# Patient Record
Sex: Female | Born: 1935 | Race: White | Hispanic: No | Marital: Married | Smoking: Never smoker
Health system: Southern US, Community
[De-identification: ages and names within clinical notes are randomized; demographics above are authoritative.]

## PROBLEM LIST (undated history)

## (undated) DIAGNOSIS — M1990 Unspecified osteoarthritis, unspecified site: Secondary | ICD-10-CM

## (undated) DIAGNOSIS — F0390 Unspecified dementia without behavioral disturbance: Secondary | ICD-10-CM

## (undated) DIAGNOSIS — C801 Malignant (primary) neoplasm, unspecified: Secondary | ICD-10-CM

## (undated) HISTORY — PX: CATARACT EXTRACTION: SUR2

## (undated) HISTORY — PX: KNEE ARTHROSCOPY W/ MENISCAL REPAIR: SHX1877

## (undated) HISTORY — PX: ABDOMINAL HYSTERECTOMY: SHX81

## (undated) HISTORY — PX: CHOLECYSTECTOMY: SHX55

---

## 1999-02-08 ENCOUNTER — Inpatient Hospital Stay (HOSPITAL_COMMUNITY): Admission: EM | Admit: 1999-02-08 | Discharge: 1999-03-11 | Payer: Self-pay | Admitting: *Deleted

## 1999-02-08 ENCOUNTER — Encounter (INDEPENDENT_AMBULATORY_CARE_PROVIDER_SITE_OTHER): Payer: Self-pay | Admitting: Specialist

## 1999-02-12 ENCOUNTER — Encounter (HOSPITAL_COMMUNITY): Payer: Self-pay | Admitting: Oncology

## 1999-02-13 ENCOUNTER — Encounter: Payer: Self-pay | Admitting: Endocrinology

## 1999-02-14 ENCOUNTER — Encounter: Payer: Self-pay | Admitting: Endocrinology

## 1999-02-16 ENCOUNTER — Encounter: Payer: Self-pay | Admitting: Internal Medicine

## 1999-02-18 ENCOUNTER — Encounter: Payer: Self-pay | Admitting: Internal Medicine

## 1999-02-20 ENCOUNTER — Encounter: Payer: Self-pay | Admitting: Internal Medicine

## 1999-02-22 ENCOUNTER — Encounter: Payer: Self-pay | Admitting: *Deleted

## 1999-02-23 ENCOUNTER — Encounter: Payer: Self-pay | Admitting: Surgery

## 1999-02-24 ENCOUNTER — Encounter: Payer: Self-pay | Admitting: *Deleted

## 1999-03-11 ENCOUNTER — Inpatient Hospital Stay: Admission: RE | Admit: 1999-03-11 | Discharge: 1999-03-18 | Payer: Self-pay | Admitting: *Deleted

## 1999-04-18 ENCOUNTER — Inpatient Hospital Stay (HOSPITAL_COMMUNITY): Admission: EM | Admit: 1999-04-18 | Discharge: 1999-05-02 | Payer: Self-pay | Admitting: *Deleted

## 1999-04-29 ENCOUNTER — Encounter: Payer: Self-pay | Admitting: *Deleted

## 1999-08-11 ENCOUNTER — Encounter: Payer: Self-pay | Admitting: Emergency Medicine

## 1999-08-11 ENCOUNTER — Emergency Department (HOSPITAL_COMMUNITY): Admission: EM | Admit: 1999-08-11 | Discharge: 1999-08-12 | Payer: Self-pay | Admitting: Emergency Medicine

## 1999-08-13 ENCOUNTER — Inpatient Hospital Stay (HOSPITAL_COMMUNITY): Admission: EM | Admit: 1999-08-13 | Discharge: 1999-09-02 | Payer: Self-pay | Admitting: Emergency Medicine

## 1999-10-28 ENCOUNTER — Ambulatory Visit (HOSPITAL_COMMUNITY): Admission: RE | Admit: 1999-10-28 | Discharge: 1999-10-28 | Payer: Self-pay | Admitting: *Deleted

## 2001-03-07 ENCOUNTER — Encounter: Payer: Self-pay | Admitting: *Deleted

## 2001-03-07 ENCOUNTER — Encounter: Admission: RE | Admit: 2001-03-07 | Discharge: 2001-03-07 | Payer: Self-pay | Admitting: *Deleted

## 2003-10-27 ENCOUNTER — Emergency Department (HOSPITAL_COMMUNITY): Admission: EM | Admit: 2003-10-27 | Discharge: 2003-10-27 | Payer: Self-pay | Admitting: Emergency Medicine

## 2004-05-09 ENCOUNTER — Encounter: Admission: RE | Admit: 2004-05-09 | Discharge: 2004-05-09 | Payer: Self-pay | Admitting: Internal Medicine

## 2004-06-14 ENCOUNTER — Emergency Department (HOSPITAL_COMMUNITY): Admission: EM | Admit: 2004-06-14 | Discharge: 2004-06-14 | Payer: Self-pay | Admitting: Emergency Medicine

## 2004-06-15 ENCOUNTER — Inpatient Hospital Stay (HOSPITAL_COMMUNITY): Admission: EM | Admit: 2004-06-15 | Discharge: 2004-06-17 | Payer: Self-pay | Admitting: Emergency Medicine

## 2004-06-16 ENCOUNTER — Encounter (INDEPENDENT_AMBULATORY_CARE_PROVIDER_SITE_OTHER): Payer: Self-pay | Admitting: Specialist

## 2004-08-03 ENCOUNTER — Encounter (INDEPENDENT_AMBULATORY_CARE_PROVIDER_SITE_OTHER): Payer: Self-pay | Admitting: Specialist

## 2004-08-03 ENCOUNTER — Ambulatory Visit (HOSPITAL_COMMUNITY): Admission: RE | Admit: 2004-08-03 | Discharge: 2004-08-03 | Payer: Self-pay | Admitting: *Deleted

## 2005-03-22 ENCOUNTER — Ambulatory Visit (HOSPITAL_COMMUNITY): Admission: RE | Admit: 2005-03-22 | Discharge: 2005-03-22 | Payer: Self-pay | Admitting: *Deleted

## 2006-09-05 ENCOUNTER — Emergency Department (HOSPITAL_COMMUNITY): Admission: EM | Admit: 2006-09-05 | Discharge: 2006-09-05 | Payer: Self-pay | Admitting: Emergency Medicine

## 2006-09-05 ENCOUNTER — Encounter: Payer: Self-pay | Admitting: Vascular Surgery

## 2009-09-01 ENCOUNTER — Inpatient Hospital Stay (HOSPITAL_COMMUNITY): Admission: EM | Admit: 2009-09-01 | Discharge: 2009-09-03 | Payer: Self-pay | Admitting: Emergency Medicine

## 2009-10-13 ENCOUNTER — Emergency Department (HOSPITAL_COMMUNITY): Admission: EM | Admit: 2009-10-13 | Discharge: 2009-10-13 | Payer: Self-pay | Admitting: Emergency Medicine

## 2009-10-19 ENCOUNTER — Emergency Department (HOSPITAL_COMMUNITY): Admission: EM | Admit: 2009-10-19 | Discharge: 2009-10-19 | Payer: Self-pay | Admitting: Emergency Medicine

## 2010-08-19 ENCOUNTER — Emergency Department (HOSPITAL_COMMUNITY): Payer: 59

## 2010-08-19 ENCOUNTER — Emergency Department (HOSPITAL_COMMUNITY)
Admission: EM | Admit: 2010-08-19 | Discharge: 2010-08-19 | Disposition: A | Discharge status: Home / Self Care | Payer: 59 | Attending: Emergency Medicine | Admitting: Emergency Medicine

## 2010-08-19 DIAGNOSIS — I2510 Atherosclerotic heart disease of native coronary artery without angina pectoris: Secondary | ICD-10-CM | POA: Insufficient documentation

## 2010-08-19 DIAGNOSIS — I498 Other specified cardiac arrhythmias: Secondary | ICD-10-CM | POA: Insufficient documentation

## 2010-08-19 DIAGNOSIS — F0390 Unspecified dementia without behavioral disturbance: Secondary | ICD-10-CM | POA: Insufficient documentation

## 2010-08-19 DIAGNOSIS — R071 Chest pain on breathing: Secondary | ICD-10-CM | POA: Insufficient documentation

## 2010-08-19 DIAGNOSIS — Z7901 Long term (current) use of anticoagulants: Secondary | ICD-10-CM | POA: Insufficient documentation

## 2010-08-19 DIAGNOSIS — I4891 Unspecified atrial fibrillation: Secondary | ICD-10-CM | POA: Insufficient documentation

## 2010-08-19 DIAGNOSIS — R079 Chest pain, unspecified: Secondary | ICD-10-CM | POA: Insufficient documentation

## 2010-08-19 DIAGNOSIS — I10 Essential (primary) hypertension: Secondary | ICD-10-CM | POA: Insufficient documentation

## 2010-08-19 LAB — CBC
MCH: 29.2 pg (ref 26.0–34.0)
MCHC: 33 g/dL (ref 30.0–36.0)
MCV: 88.3 fL (ref 78.0–100.0)
Platelets: 183 10*3/uL (ref 150–400)
RBC: 5.04 MIL/uL (ref 3.87–5.11)
RDW: 13.9 % (ref 11.5–15.5)
WBC: 9.4 10*3/uL (ref 4.0–10.5)

## 2010-08-19 LAB — COMPREHENSIVE METABOLIC PANEL
ALT: 26 U/L (ref 0–35)
AST: 42 U/L — ABNORMAL HIGH (ref 0–37)
Albumin: 3 g/dL — ABNORMAL LOW (ref 3.5–5.2)
Alkaline Phosphatase: 167 U/L — ABNORMAL HIGH (ref 39–117)
BUN: 23 mg/dL (ref 6–23)
CO2: 26 mEq/L (ref 19–32)
Calcium: 8.8 mg/dL (ref 8.4–10.5)
Chloride: 103 mEq/L (ref 96–112)
Creatinine, Ser: 1.28 mg/dL — ABNORMAL HIGH (ref 0.4–1.2)
GFR calc Af Amer: 48 mL/min — ABNORMAL LOW (ref >60)
Glucose, Bld: 91 mg/dL (ref 70–99)
Potassium: 4.3 mEq/L (ref 3.5–5.1)
Sodium: 137 mEq/L (ref 135–145)
Total Bilirubin: 0.5 mg/dL (ref 0.3–1.2)
Total Protein: 7.6 g/dL (ref 6.0–8.3)

## 2010-08-19 LAB — DIFFERENTIAL
Basophils Absolute: 0.1 10*3/uL (ref 0.0–0.1)
Basophils Relative: 1 % (ref 0–1)
Eosinophils Absolute: 0.2 10*3/uL (ref 0.0–0.7)
Eosinophils Relative: 2 % (ref 0–5)
Lymphocytes Relative: 17 % (ref 12–46)
Lymphs Abs: 1.6 10*3/uL (ref 0.7–4.0)
Neutro Abs: 6.2 10*3/uL (ref 1.7–7.7)
Neutrophils Relative %: 66 % (ref 43–77)

## 2010-08-19 LAB — POCT CARDIAC MARKERS
CKMB, poc: <1 ng/mL — ABNORMAL LOW (ref 1.0–8.0)
Myoglobin, poc: 109 ng/mL (ref 12–200)
Troponin i, poc: <0.05 ng/mL (ref 0.00–0.09)

## 2010-08-20 ENCOUNTER — Emergency Department (HOSPITAL_COMMUNITY)
Admission: EM | Admit: 2010-08-20 | Discharge: 2010-08-20 | Disposition: A | Discharge status: Home / Self Care | Payer: 59 | Attending: Emergency Medicine | Admitting: Emergency Medicine

## 2010-08-20 DIAGNOSIS — I4891 Unspecified atrial fibrillation: Secondary | ICD-10-CM | POA: Insufficient documentation

## 2010-08-20 DIAGNOSIS — I2510 Atherosclerotic heart disease of native coronary artery without angina pectoris: Secondary | ICD-10-CM | POA: Insufficient documentation

## 2010-08-20 DIAGNOSIS — R071 Chest pain on breathing: Secondary | ICD-10-CM | POA: Insufficient documentation

## 2010-08-20 DIAGNOSIS — Z7901 Long term (current) use of anticoagulants: Secondary | ICD-10-CM | POA: Insufficient documentation

## 2010-08-20 DIAGNOSIS — Z86718 Personal history of other venous thrombosis and embolism: Secondary | ICD-10-CM | POA: Insufficient documentation

## 2010-08-20 DIAGNOSIS — I10 Essential (primary) hypertension: Secondary | ICD-10-CM | POA: Insufficient documentation

## 2010-08-20 DIAGNOSIS — F0390 Unspecified dementia without behavioral disturbance: Secondary | ICD-10-CM | POA: Insufficient documentation

## 2010-08-20 DIAGNOSIS — R0789 Other chest pain: Secondary | ICD-10-CM | POA: Insufficient documentation

## 2010-08-20 LAB — POCT CARDIAC MARKERS
CKMB, poc: 1 ng/mL (ref 1.0–8.0)
Myoglobin, poc: 116 ng/mL (ref 12–200)

## 2010-08-23 LAB — URINALYSIS, ROUTINE W REFLEX MICROSCOPIC
Bilirubin Urine: NEGATIVE
Glucose, UA: NEGATIVE mg/dL
Hgb urine dipstick: NEGATIVE
Ketones, ur: NEGATIVE mg/dL
Nitrite: POSITIVE — AB
Protein, ur: 100 mg/dL — AB
Specific Gravity, Urine: 1.012 (ref 1.005–1.030)
Urobilinogen, UA: 0.2 mg/dL (ref 0.0–1.0)
pH: 7 (ref 5.0–8.0)

## 2010-08-23 LAB — CSF CELL COUNT WITH DIFFERENTIAL
RBC Count, CSF: 37 /mm3 — ABNORMAL HIGH
Tube #: 1
WBC, CSF: 1 /mm3 (ref 0–5)
WBC, CSF: 1 /mm3 (ref 0–5)

## 2010-08-23 LAB — URINE CULTURE: Colony Count: >=100000

## 2010-08-23 LAB — COMPREHENSIVE METABOLIC PANEL
ALT: 20 U/L (ref 0–35)
AST: 46 U/L — ABNORMAL HIGH (ref 0–37)
Albumin: 2.8 g/dL — ABNORMAL LOW (ref 3.5–5.2)
Alkaline Phosphatase: 172 U/L — ABNORMAL HIGH (ref 39–117)
BUN: 24 mg/dL — ABNORMAL HIGH (ref 6–23)
CO2: 25 mEq/L (ref 19–32)
Calcium: 8.2 mg/dL — ABNORMAL LOW (ref 8.4–10.5)
Chloride: 104 mEq/L (ref 96–112)
Creatinine, Ser: 1.25 mg/dL — ABNORMAL HIGH (ref 0.4–1.2)
GFR calc Af Amer: 49 mL/min — ABNORMAL LOW (ref >60)
GFR calc non Af Amer: 41 mL/min — ABNORMAL LOW (ref >60)
Glucose, Bld: 113 mg/dL — ABNORMAL HIGH (ref 70–99)
Potassium: 4.8 mEq/L (ref 3.5–5.1)
Sodium: 135 mEq/L (ref 135–145)
Total Bilirubin: 0.9 mg/dL (ref 0.3–1.2)
Total Protein: 7.1 g/dL (ref 6.0–8.3)

## 2010-08-23 LAB — DIFFERENTIAL
Band Neutrophils: 0 % (ref 0–10)
Basophils Absolute: 0 10*3/uL (ref 0.0–0.1)
Basophils Relative: 0 % (ref 0–1)
Blasts: 0 %
Eosinophils Absolute: 0.4 10*3/uL (ref 0.0–0.7)
Eosinophils Relative: 2 % (ref 0–5)
Lymphocytes Relative: 11 % — ABNORMAL LOW (ref 12–46)
Lymphs Abs: 2.1 10*3/uL (ref 0.7–4.0)
Metamyelocytes Relative: 0 %
Monocytes Absolute: 1.7 10*3/uL — ABNORMAL HIGH (ref 0.1–1.0)
Monocytes Relative: 9 % (ref 3–12)
Myelocytes: 0 %
Neutro Abs: 14.5 10*3/uL — ABNORMAL HIGH (ref 1.7–7.7)
Neutrophils Relative %: 78 % — ABNORMAL HIGH (ref 43–77)
Promyelocytes Absolute: 0 %
WBC Morphology: INCREASED
nRBC: 0 /100 WBC

## 2010-08-23 LAB — GRAM STAIN: Gram Stain: NONE SEEN

## 2010-08-23 LAB — CBC
HCT: 39.8 % (ref 36.0–46.0)
Hemoglobin: 13 g/dL (ref 12.0–15.0)
MCHC: 32.5 g/dL (ref 30.0–36.0)
MCV: 79.7 fL (ref 78.0–100.0)
Platelets: 178 10*3/uL (ref 150–400)
RBC: 5 MIL/uL (ref 3.87–5.11)
RDW: 25 % — ABNORMAL HIGH (ref 11.5–15.5)
WBC: 18.7 10*3/uL — ABNORMAL HIGH (ref 4.0–10.5)

## 2010-08-23 LAB — CSF CULTURE W GRAM STAIN
Culture: NO GROWTH
Gram Stain: NONE SEEN

## 2010-08-23 LAB — URINE MICROSCOPIC-ADD ON

## 2010-08-23 LAB — PROTEIN AND GLUCOSE, CSF
Glucose, CSF: 52 mg/dL (ref 43–76)
Total  Protein, CSF: 29 mg/dL (ref 15–45)

## 2010-08-23 LAB — SEDIMENTATION RATE: Sed Rate: 22 mm/hr (ref 0–22)

## 2010-08-24 LAB — CBC
HCT: 30.1 % — ABNORMAL LOW (ref 36.0–46.0)
Hemoglobin: 9.6 g/dL — ABNORMAL LOW (ref 12.0–15.0)
Hemoglobin: 9.7 g/dL — ABNORMAL LOW (ref 12.0–15.0)
MCHC: 32.1 g/dL (ref 30.0–36.0)
MCHC: 32.3 g/dL (ref 30.0–36.0)
MCV: 73 fL — ABNORMAL LOW (ref 78.0–100.0)
RBC: 4.07 MIL/uL (ref 3.87–5.11)
RBC: 4.16 MIL/uL (ref 3.87–5.11)
RDW: 27.1 % — ABNORMAL HIGH (ref 11.5–15.5)
RDW: 27.8 % — ABNORMAL HIGH (ref 11.5–15.5)

## 2010-08-29 LAB — BASIC METABOLIC PANEL
CO2: 26 mEq/L (ref 19–32)
Calcium: 8.4 mg/dL (ref 8.4–10.5)
Creatinine, Ser: 1.19 mg/dL (ref 0.4–1.2)
GFR calc Af Amer: 52 mL/min — ABNORMAL LOW (ref >60)
GFR calc non Af Amer: 43 mL/min — ABNORMAL LOW (ref >60)
Glucose, Bld: 97 mg/dL (ref 70–99)

## 2010-08-29 LAB — CBC
HCT: 29 % — ABNORMAL LOW (ref 36.0–46.0)
HCT: 29.2 % — ABNORMAL LOW (ref 36.0–46.0)
Hemoglobin: 9.3 g/dL — ABNORMAL LOW (ref 12.0–15.0)
MCHC: 31.7 g/dL (ref 30.0–36.0)
MCHC: 32 g/dL (ref 30.0–36.0)
MCV: 72.4 fL — ABNORMAL LOW (ref 78.0–100.0)
Platelets: 207 10*3/uL (ref 150–400)
RBC: 4.01 MIL/uL (ref 3.87–5.11)
RDW: 28.3 % — ABNORMAL HIGH (ref 11.5–15.5)
WBC: 6.6 10*3/uL (ref 4.0–10.5)

## 2010-08-29 LAB — POCT CARDIAC MARKERS
CKMB, poc: 3.7 ng/mL (ref 1.0–8.0)
Myoglobin, poc: >500 ng/mL (ref 12–200)
Troponin i, poc: <0.05 ng/mL (ref 0.00–0.09)

## 2010-08-29 LAB — URINALYSIS, ROUTINE W REFLEX MICROSCOPIC
Hgb urine dipstick: NEGATIVE
Specific Gravity, Urine: 1.014 (ref 1.005–1.030)
Urobilinogen, UA: 0.2 mg/dL (ref 0.0–1.0)
pH: 7.5 (ref 5.0–8.0)

## 2010-08-29 LAB — URINE CULTURE

## 2010-08-29 LAB — IRON AND TIBC
Iron: 18 ug/dL — ABNORMAL LOW (ref 42–135)
Saturation Ratios: 6 % — ABNORMAL LOW (ref 20–55)
UIBC: 278 ug/dL

## 2010-08-29 LAB — URINE MICROSCOPIC-ADD ON

## 2010-08-29 LAB — HEMOGLOBIN AND HEMATOCRIT, BLOOD
HCT: 29.6 % — ABNORMAL LOW (ref 36.0–46.0)
Hemoglobin: 9.4 g/dL — ABNORMAL LOW (ref 12.0–15.0)

## 2010-08-29 LAB — DIFFERENTIAL
Basophils Absolute: 0.1 10*3/uL (ref 0.0–0.1)
Eosinophils Absolute: 0.1 10*3/uL (ref 0.0–0.7)
Lymphocytes Relative: 14 % (ref 12–46)
Monocytes Absolute: 0.7 10*3/uL (ref 0.1–1.0)
Neutrophils Relative %: 74 % (ref 43–77)
Smear Review: ADEQUATE

## 2010-08-29 LAB — TYPE AND SCREEN: Antibody Screen: NEGATIVE

## 2010-08-29 LAB — PROTIME-INR: Prothrombin Time: 14.1 seconds (ref 11.6–15.2)

## 2010-08-29 LAB — LACTIC ACID, PLASMA: Lactic Acid, Venous: 2.5 mmol/L — ABNORMAL HIGH (ref 0.5–2.2)

## 2010-08-29 LAB — VITAMIN B12: Vitamin B-12: 1009 pg/mL — ABNORMAL HIGH (ref 211–911)

## 2010-10-21 NOTE — Op Note (Signed)
[name], Megan Ho                  ACCOUNT NO.:  [id number]   MEDICAL RECORD NO.:  [id number]          PATIENT TYPE:  INP   LOCATION:  0362                         FACILITY:  [facility]   PHYSICIAN:  Georgiana Spinner, M.D.    DATE OF BIRTH:  11/05/1921   DATE OF PROCEDURE:  06/16/2004  DATE OF DISCHARGE:                                 OPERATIVE REPORT   PROCEDURE:  Upper endoscopy with biopsy.   INDICATIONS:  Abdominal pain.   ANESTHESIA:  Demerol 30, Versed 4 milligrams.   PROCEDURE:  With the patient mildly sedated in the left lateral decubitus  position the Olympus videoscopic endoscope was inserted in the mouth and  passed under direct vision into the esophagus and upon entering the  esophagus appeared to be small bowel blood in the esophagus.  We advanced  distally to the gastroesophageal junction into the stomach where there was  some dark material, a small amount in the stomach, indicating some blood  mixed with acid,  but the fundus, body, antrum, duodenal bulb, second  portion of duodenum were visualized and appeared normal. Photographs taken.  From this point the endoscope was slowly withdrawn taking circumferential  views of duodenal mucosa until the endoscope had been pulled back in the  stomach,  placed in retroflexion to view stomach from below. The endoscope  was then straightened and withdrawn taking circumferential views remaining  gastric and esophageal mucosa stopping in the distal esophagus were an ulcer  was seen that was greater than 1 cm in size.  It had a cottony white fluffy  base that was oozing some blood. This was biopsied and this once biopsies  were obtained around the perimeter of this ulcer, the endoscope was  withdrawn. The patient's vital signs and pulse oximeter remained stable. The  patient tolerated procedure well without apparent complication.   FINDINGS:  Esophageal ulcer.  Await biopsy reports.  Will add Carafate and  look for a clinical  response.      GMO/MEDQ  D:  06/16/2004  T:  06/16/2004  Job:  [id number]

## 2010-10-21 NOTE — Op Note (Signed)
[name], Megan Ho                  ACCOUNT NO.:  [id number]   MEDICAL RECORD NO.:  [id number]          PATIENT TYPE:  AMB   LOCATION:  ENDO                         FAC[facility]   PHYSICIAN:  Georgiana Spinner, M.D.    DATE OF BIRTH:  08-24-1921   DATE OF PROCEDURE:  08/03/2004  DATE OF DISCHARGE:                                 OPERATIVE REPORT   PROCEDURE:  Endoscopy with biopsy.   INDICATIONS FOR PROCEDURE:  GERD.   ANESTHESIA:  Demerol 30, Versed 3.5 mg.   DESCRIPTION OF PROCEDURE:  With the patient mildly sedated in the left  lateral decubitus position, the Olympus videoscopic endoscope was inserted  in the mouth and passed under direct vision through the esophagus which  appeared completely normal at this time. The previously noted ulcer was now  completely healed. There was no evidence of Barrett's esophagus. We entered  into the stomach.  The fundus, body and antrum were visualized as was the  duodenal bulb and second portion of the duodenum. From this point, the  endoscope was slowly withdrawn taking circumferential views of duodenal  mucosa until the endoscope had been pulled back into the stomach, placed in  retroflexion to view the stomach from below. The endoscope was then  straightened and withdrawn taking circumferential views remaining gastric  and esophageal mucosa stopping to photograph and biopsy changes of gastritis  in the antrum and body. There was also an AVM seen in the fundus which was  photographed only. The endoscope was withdrawn. The patient's vital signs  and pulse oximeter remained stable. The patient tolerated procedure well  without apparent complications.   FINDINGS:  1.  AVM of fundus.  2.  Gastritis of the antrum and body biopsied, await biopsy report. The      patient will call me for results and follow-up with me as an outpatient.  3.  Complete healing of previously noted esophageal ulcer that had atypia on      biopsy previously.      GMO/MEDQ  D:  08/03/2004  T:  08/03/2004  Job:  [id number]

## 2010-10-21 NOTE — Discharge Summary (Signed)
[place]  Patient:    Megan Ho, Megan Ho                           MRN: [id number] Adm. Date:  03/11/99 Disc. Date: 03/18/99 Attending:  Warrick Parisian, M.D.                           Discharge Summary  DATE OF BIRTH:  [birth year]  DISCHARGE DIAGNOSES:  1. Deconditioning.  2. Malnutrition.  3. 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  4. Renal insufficiency.  5. History of severe colonic ileus, which has resolved.  6. Hyponatremia, resolved.  7. Sacral decubitus ulcer.  8. Hypertension.  9. Depression. 10. Atrial fibrillation in the past, and now in current sinus rhythm. 11. Gastroesophageal reflux disease with gastritis. 12. Osteoporosis.  DISCHARGE MEDICATIONS:  1. Miacalcin nasal spray 200 units 1 spray daily in alternating nostrils.  2. Os-Cal 500 mg p.o. b.i.d.  3. Prilosec 20 mg p.o. q.d.  4. Tiazac 240 mg 1 daily.  5. Folic acid 1 mg p.o. q.d.  6. Effexor XR 37.5 mg p.o. q.d.  7. Ferrous sulfate 325 mg p.o. b.i.d.  8. Multivitamin 1 p.o. q.d.  9. Prednisone 20 mg 2 tablets daily.  ACTIVITY:  Up with assistance.  DIET:  Supplements three times daily, with regular diet.  WOUND CARE:  Sacral ulcer, home health nurse will help with dressing changes at  least weekly, and wound care management.  [facility] was set up for a nurse, aide, and physical therapist.  FOLLOW-UP:  In one week with Dr. Traci Sermon.  At that time, the patient will need a CBC and CMET.  HISTORY OF PRESENT ILLNESS:  For details of admission, please refer to the priority transfer summary found in the chart.  Briefly, the patient is a 77 year old female who was transferred to [facility] on March 11, 1999, after a prolonged hospitalization at [facility] for multiple medical problems.  The problems seemed to have stabilized, and she was transferred to [facility] for rehabilitation with physical therapy,  occupational therapy, etc.  The patients laboratories during her course at [facility] remained stable.  Hemoglobin was 8.3 on arrival, platelet count 123,000.  Sodium 133, BUN 35, creatinine 1.1. Alkaline phosphatase 361, SGOT 82, SGPT 85.  Albumin 1.7 with a protein of 4.6 and a calcium of 7.3.  Her hemoglobin was 8.4 on March 16, 1999, platelet count 114,000, and this was all stable.  Her liver enzymes had also remained relatively stable while she was hospitalized.  She underwent physical therapy and rehabilitation, with improvement in her strength as well as nutritional status.  She received treatment for her decubitus ulcer.  She did develop some lower extremity edema secondary o venous insufficiency and hypoalbuminemia.  This improved with elevation and ambulation as well as improved nutritional status.  The patient did have some dyspnea with exertion.  A PA and lateral chest x-ray showed small bilateral pleural effusions, left greater than right, with mild cardiomegaly and diffuse osteopenia. The patients O2 saturation remained in the mid to upper 90s, and she was felt to be stable and ready for discharge on March 18, 1999.  She was discharged home  with home health nurse, aide, and physical therapy.  CONDITION ON DISCHARGE:  Stable and improved. DD:  06/15/99 TD:  06/15/99 Job: [id number]

## 2010-10-21 NOTE — Discharge Summary (Signed)
[name], [name]                  ACCOUNT NO.:  [id number]   MEDICAL RECORD NO.:  [id number]          PATIENT TYPE:  INP   LOCATION:  0362                         FACILITY:  [facility]   PHYSICIAN:  Mobolaji B. Bakare, M.D.DATE OF BIRTH:  1921-07-05   DATE OF ADMISSION:  06/15/2004  DATE OF DISCHARGE:  06/17/2004                                 DISCHARGE SUMMARY   PRIMARY CARE PHYSICIAN:  Dr. Georgianne Fick.   GASTROENTEROLOGIST:  Dr. Virginia Rochester.   CONSULTS:  Dr. Virginia Rochester.   FINAL DIAGNOSES:  1.  Esophageal ulcer.  2.  Klebsiella pneumoniae urinary tract infection.  3.  Diarrhea.  4.  Elevated alkaline phosphatase.   SECONDARY DIAGNOSES:  1.  History of atrial fibrillation.  2.  Autoimmune hepatitis.  3.  Liver cirrhosis.  4.  Hypertension.  5.  Gout.  6.  History of congestive heart failure.  7.  History of coronary artery disease.  8.  Sigmoid diverticulosis.   CHIEF COMPLAINT:  Abdominal pain.   BRIEF HISTORY:  Please refer to admission H&P for full details.  In brief,  Megan Ho is an 77 year old Caucasian female with multiple medical problems.  She presented with abdominal pain and prior to hospitalization she had an ER  visit whereby she was treated for abdominal pain and she improved, hence  sent home.  She had to come back because the abdominal pain recurred.  The  pain is more in the epigastric region and it recurs normally after taking  vitamins.  She had a normal CMET except for elevated alkaline phosphatase.  Lipase was normal.  CT scan of the abdomen showed cirrhotic liver, abnormal  lung base densities, 1 cm hypodensity right liver lobe, diverticulosis of  the sigmoid colon.   PROCEDURES:  Upper endoscopy:  Esophageal ulceration, greater than 1 cm in  size.  Biopsies were taken.  The ulceration was found at the distal  esophagus.   PERTINENT PHYSICAL FINDINGS:  Initial vital signs:  Blood pressure 159/88,  pulse of 71, respiratory rate of 22, temperature of 96.6.   [name] is a  frail, elderly Caucasian female, oriented in time, place, and person.  Main  findings were on abdominal examination.  Abdomen was soft, diffuse right  upper quadrant and left upper quadrant tenderness, also epigastric  tenderness.  There was no rebound or guarding.  Bowel sounds are present.  No palpable hepatosplenomegaly.  The rest of physical examination were  within normal.   PERTINENT LABORATORY FINDINGS:  White cell count 8.3, hemoglobin 13.9,  hematocrit 40.4, MCV 86.8, platelets 242.  Point of care cardiac enzymes  normal.  Sodium 136, potassium 4.0, chloride 108, bicarb 21, glucose 95, BUN  30, creatinine 1.3, calcium 8.1, albumin 2.7, AST 30, ALT 23, alkaline  phosphatase 172, total bilirubin 0.7, lipase 40.  Urinalysis was positive  for nitrite and leukocytes was moderate.  Microscopy showed many bacteria  but no white cells.  Urine culture grew Klebsiella pneumoniae greater than  100,000 sensitive to ciprofloxacin and resistant to ampicillin.  Lactic acid  1.5.  Fecal occult blood was  negative.  Phosphorus 4.2, magnesium 1.7.   RADIOLOGIC DATA:  CT scan as described in the HPI.   HOSPITAL COURSE:  Ms. Staples was admitted and managed conservatively.  Aleve  was discontinued.  She was given Aleve for myalgia which she probably  developed as a consequence of starting medication Lipitor.  She was taken  for endoscopy by Dr. Virginia Rochester and esophageal ulceration was found in the distal  esophagus and this is possibly the cause of epigastric pain.  She was  started on Carafate and continued on PPI.   The patient had a bout of diarrhea during the course of hospitalization.  This was also managed conservatively with Imodium and Fleet.   UTI:  She was empirically started on ciprofloxacin IV and when the culture  came back she was continued on p.o. Cipro.  She remained afebrile during the  course of hospitalization.   Elevated ALP is probably secondary to liver cirrhosis.   This will be  monitored by Dr. Nicholos Johns in the office or Dr. Virginia Rochester.  The patient at this  point has stopped Lipitor.  All other home medications were continued during  this hospitalization.   DISCHARGE CONDITION:  She was in stable condition.  Epigastric pain resolved  at the time of discharge and she was hemodynamically stable.   DISCHARGE MEDICATIONS:  1.  Ciprofloxacin 200 mg p.o. b.i.d.  2.  Carafate 1 g with meals and at bedtime four times a day.  3.  She was instructed to continue home medications and also to stop Aleve      and use Tylenol.   FOLLOW-UP:  Follow up with Dr. Nicholos Johns in 1-2 weeks and Dr. Virginia Rochester in 4  weeks; she should call for appointment.   RECOMMENDATIONS:  Follow-up CT scan of the abdomen and chest to follow up  the abnormal densities found on the lung bases and also the hypodensity area  in the right lobe of the liver.      MBB/MEDQ  D:  06/26/2004  T:  06/26/2004  Job:  [id number]   cc:   Georgianne Fick, M.D.  [address]  [place]  [place] [address]  Fax: [phone]   Georgiana Spinner, M.D.  [address]  [place]  [place] [address]  Fax: [phone]

## 2010-10-21 NOTE — H&P (Signed)
Megan Ho, Megan Ho NO.:  [id number]   MEDICAL RECORD NO.:  [id number]          PATIENT TYPE:  EMS   LOCATION:  ED                           FACILITY:  [facility]   PHYSICIAN:  Mallory Shirk, MD     DATE OF BIRTH:  [birth year]   DATE OF ADMISSION:  06/15/2004  DATE OF DISCHARGE:                                HISTORY & PHYSICAL   PRIMARY CARE PHYSICIAN:  Georgianne Fick, M.D.   GASTROENTEROLOGIST:  Georgiana Spinner, M.D.   CHIEF COMPLAINT:  Abdominal pain brought on by p.o. intake.   HISTORY OF PRESENT ILLNESS:  Megan Ho is a very pleasant 77 year old  Caucasian woman who presented to the emergency department at [facility] on  June 14, 2004 with complaints of abdominal pain. The patient states that  pain started after she and her husband went out for lunch earlier during the  day and the pain would not subside hence the patient came to the ED. She was  seen in the ED and treated with Toradol IV and morphine IV.  The pain  subsided.  Labs done at that time were essentially within normal limits with  a lipase of 40, LFT's normal except alkaline phosphatase which was slightly  elevated at 172.  Point of care cardiac enzymes were negative and patient  was discharged home.   However, the patient states that pain was unbearable at home and  subsequently patient came to the ED again on June 15, 2004 at about 1:30  a.m.  She was given morphine for pain and Zofran for nausea upon which  symptoms were relieved a little bit. Later in the morning, she was given  some cereal upon which her pain came back and was worse than it was before.  At this point, the patient was also given Vicodin but this did not seem to  control her pain.  The patient was subsequently admitted for further workup.   PAST MEDICAL HISTORY:  From PCP's office:  1.  HTN.  2.  CHF.  3.  CAD.  4.  Atrial fibrillation.  5.  GERD.  6.  Osteoporosis.  7.  Osteoarthritis.  8.  Autoimmune  hepatitis.   MEDICATIONS ON ADMISSION:  1.  Plavix 75 mcg p.o. q.d.  2.  Prilosec 20 mg p.o. q.d.  3.  Tiazac 240 mg p.o. q.d.  4.  Temazepam 15 mg p.o. q.d.  5.  Prochlorperazine 10 mg p.o. q.d. p.r.n.  6.  Os-Cal 500 mg p.o. b.i.d.  7.  MVI.  8.  Ferrous sulfate 325 mg p.o. b.i.d.   ALLERGIES:  The patient has a long list of allergies which include:  1.  WELLBUTRIN.  2.  SULFA.  3.  ASPIRIN.  4.  CODEINE.  5.  ERYTHROMYCIN.  6.  PHENERGAN.  7.  THORAZINE.  8.  PCN.  9.  BENADRYL.  10. BUTAZOLIDIN.  11. DEMEROL.  12. HYDROCODONE.  13. ENDOCET.   FAMILY HISTORY:  Noncontributory.   SOCIAL HISTORY:  The patient lives with her husband, no alcohol, tobacco or  drug use.   REVIEW OF SYMPTOMS:  The patient denies any headaches, any recent visual  changes, dysphagia, no chest pain at the time of exam, no respiratory  discomfort, diffuse abdominal pain as mentioned in the HPI, no dysuria, no  hematochezia, no recent change in bowel habits, no recent weight loss.  Chronic extremity pain secondary to osteoarthritis, pain in the right  extremity secondary to carpal tunnel syndrome.   PHYSICAL EXAMINATION ON ADMISSION:  VITAL SIGNS:  Blood pressure 159/88,  pulse 71, respirations 22, temperature 96.6.  GENERAL:  Frail, elderly Caucasian woman in mild distress, alert and  oriented x3, cooperative with the exam.  HEENT:  Normocephalic, atraumatic.  Pupils unequal, left pupil oval shaped.  The patient says she has had this condition for a long time.  NECK:  Supple, oropharynx nonerythematous, no JVD, no LAD.  LUNGS:  Clear to auscultation bilaterally, no wheezes, no rales.  CARDIOVASCULAR:  S1 plus S2, regular rate and rhythm.  ABDOMEN:  Soft, positive bowel sounds, diffuse right upper quadrant, left  upper quadrant and diffuse right upper quadrant, left upper quadrant,  epigastric and suprapubic pain.  EXTREMITIES:  Bilateral nonpitting edema in the lower extremities chronic   per patient.  NEUROLOGIC:  Cranial nerves II-XII grossly intact. Sensory and motor within  normal limits.  No focal deficit seen.   LABORATORY DATA:  Chest x-ray:  Stable bronchitis changes with left lower  lobe scarring, no acute disease.   Labs on admission, WBC is 8.3, hemoglobin 13.9, hematocrit 40.4, MCV 86.8,  platelets 242.  Point of care cardiac enzymes negative.  Sodium 136,  potassium 4.0, chloride 108, carbon dioxide 21, glucose 95, BUN 30,  creatinine 1.3, calcium 8.1, albumin 2.7.  AST 30, ALT 23, alkaline  phosphatase 172, total bilirubin 0.7, lipase 40.   ASSESSMENT:  An 77 year old Caucasian woman with a history of GERD, coronary  artery disease, CHF who presents with abdominal pain, onset noticed after  eating lunch on June 14, 2004.   1.  Abdominal pain likely secondary to gastritis.  The patient states that a      few days prior to this episode, her Aleve was increased to four a day.      This was to control myalgia secondary to Lipitor which has been now      stopped.  However, on exam, the patient's tenderness is pretty      significant, she also reports exquisite pain upon p.o. intake, liquid or      solid.  This was observed with a sip of water and pain started.  Dr.      Virginia Rochester, gastroenterology, who knows the patient well has been consulted. He      will see the patient.  A CT scan of the abdomen is ordered with and      without contrast to further evaluate the patient.  She is also on zofran      4 mg IV q.6 hours p.r.n. nausea and vomiting.  With the patient's      multiple pain allergies,  at this time she will be given Tylenol for      pain p.r.n.  Once GI workup is complete, a pain medication regimen will      be worked out.  2.  Gastroesophageal reflux disease.  The patient has been started on      protonix 40 mg IV q.d.  Gastroenterology evaluation pending as mentioned      in #1.  3.  Hypertension.  The patient's Tiazac has been started, admission  blood     pressure was 159/88 with a subsequent reading at 143/83.  4.  The patient is n.p.o. with IV fluids n.p.o. for now except medications      with IV fluids at 75 mg per hour. Home medications have been started.   DISPOSITION:  After a GI workup the patient will be discharged to home.      GDK/MEDQ  D:  06/15/2004  T:  06/15/2004  Job:  [id number]   cc:   Georgiana Spinner, M.D.  [address]  [place]  [place] [address]  Fax: [phone]   Georgianne Fick, M.D.  [address]  [place]  [place] [address]  Fax: [phone]

## 2013-07-02 ENCOUNTER — Other Ambulatory Visit: Payer: Self-pay | Admitting: Dermatology

## 2013-11-21 ENCOUNTER — Other Ambulatory Visit: Payer: Self-pay | Admitting: Dermatology

## 2014-04-16 ENCOUNTER — Emergency Department (HOSPITAL_COMMUNITY): Payer: Medicare Other

## 2014-04-16 ENCOUNTER — Encounter (HOSPITAL_COMMUNITY): Payer: Self-pay | Admitting: Emergency Medicine

## 2014-04-16 ENCOUNTER — Emergency Department (HOSPITAL_COMMUNITY)
Admission: EM | Admit: 2014-04-16 | Discharge: 2014-04-16 | Disposition: A | Discharge status: Home / Self Care | Payer: Medicare Other | Attending: Emergency Medicine | Admitting: Emergency Medicine

## 2014-04-16 DIAGNOSIS — R109 Unspecified abdominal pain: Secondary | ICD-10-CM

## 2014-04-16 DIAGNOSIS — Z90710 Acquired absence of both cervix and uterus: Secondary | ICD-10-CM | POA: Diagnosis not present

## 2014-04-16 DIAGNOSIS — Z79899 Other long term (current) drug therapy: Secondary | ICD-10-CM | POA: Diagnosis not present

## 2014-04-16 DIAGNOSIS — Z8739 Personal history of other diseases of the musculoskeletal system and connective tissue: Secondary | ICD-10-CM | POA: Insufficient documentation

## 2014-04-16 DIAGNOSIS — R1031 Right lower quadrant pain: Secondary | ICD-10-CM | POA: Diagnosis present

## 2014-04-16 DIAGNOSIS — Z7902 Long term (current) use of antithrombotics/antiplatelets: Secondary | ICD-10-CM | POA: Diagnosis not present

## 2014-04-16 DIAGNOSIS — F0390 Unspecified dementia without behavioral disturbance: Secondary | ICD-10-CM | POA: Insufficient documentation

## 2014-04-16 DIAGNOSIS — R609 Edema, unspecified: Secondary | ICD-10-CM | POA: Diagnosis not present

## 2014-04-16 DIAGNOSIS — Z85828 Personal history of other malignant neoplasm of skin: Secondary | ICD-10-CM | POA: Diagnosis not present

## 2014-04-16 DIAGNOSIS — Z9089 Acquired absence of other organs: Secondary | ICD-10-CM | POA: Insufficient documentation

## 2014-04-16 HISTORY — DX: Unspecified osteoarthritis, unspecified site: M19.90

## 2014-04-16 HISTORY — DX: Unspecified dementia, unspecified severity, without behavioral disturbance, psychotic disturbance, mood disturbance, and anxiety: F03.90

## 2014-04-16 HISTORY — DX: Malignant (primary) neoplasm, unspecified: C80.1

## 2014-04-16 LAB — URINALYSIS, ROUTINE W REFLEX MICROSCOPIC
BILIRUBIN URINE: NEGATIVE
Glucose, UA: NEGATIVE mg/dL
Hgb urine dipstick: NEGATIVE
KETONES UR: NEGATIVE mg/dL
NITRITE: NEGATIVE
Protein, ur: 30 mg/dL — AB
Specific Gravity, Urine: 1.017 (ref 1.005–1.030)
UROBILINOGEN UA: 0.2 mg/dL (ref 0.0–1.0)
pH: 5.5 (ref 5.0–8.0)

## 2014-04-16 LAB — CBC WITH DIFFERENTIAL/PLATELET
Basophils Absolute: 0.1 10*3/uL (ref 0.0–0.1)
Basophils Relative: 1 % (ref 0–1)
Eosinophils Absolute: 0.2 10*3/uL (ref 0.0–0.7)
Eosinophils Relative: 3 % (ref 0–5)
HCT: 39.3 % (ref 36.0–46.0)
HEMOGLOBIN: 13.5 g/dL (ref 12.0–15.0)
LYMPHS PCT: 18 % (ref 12–46)
Lymphs Abs: 1.5 10*3/uL (ref 0.7–4.0)
MCH: 30.3 pg (ref 26.0–34.0)
MCHC: 34.4 g/dL (ref 30.0–36.0)
MCV: 88.3 fL (ref 78.0–100.0)
MONO ABS: 1 10*3/uL (ref 0.1–1.0)
Monocytes Relative: 13 % — ABNORMAL HIGH (ref 3–12)
NEUTROS ABS: 5.5 10*3/uL (ref 1.7–7.7)
Neutrophils Relative %: 65 % (ref 43–77)
Platelets: 151 10*3/uL (ref 150–400)
RBC: 4.45 MIL/uL (ref 3.87–5.11)
RDW: 13.9 % (ref 11.5–15.5)
WBC: 8.2 10*3/uL (ref 4.0–10.5)

## 2014-04-16 LAB — COMPREHENSIVE METABOLIC PANEL
ALT: 36 U/L — ABNORMAL HIGH (ref 0–35)
ANION GAP: 11 (ref 5–15)
AST: 56 U/L — ABNORMAL HIGH (ref 0–37)
Albumin: 2.8 g/dL — ABNORMAL LOW (ref 3.5–5.2)
Alkaline Phosphatase: 247 U/L — ABNORMAL HIGH (ref 39–117)
BILIRUBIN TOTAL: 0.4 mg/dL (ref 0.3–1.2)
BUN: 23 mg/dL (ref 6–23)
CHLORIDE: 101 meq/L (ref 96–112)
CO2: 26 meq/L (ref 19–32)
CREATININE: 1.28 mg/dL — AB (ref 0.50–1.10)
Calcium: 9 mg/dL (ref 8.4–10.5)
GFR calc Af Amer: 41 mL/min — ABNORMAL LOW (ref >90)
GFR, EST NON AFRICAN AMERICAN: 36 mL/min — AB (ref >90)
Glucose, Bld: 108 mg/dL — ABNORMAL HIGH (ref 70–99)
Potassium: 3.9 mEq/L (ref 3.7–5.3)
Sodium: 138 mEq/L (ref 137–147)
Total Protein: 7.2 g/dL (ref 6.0–8.3)

## 2014-04-16 LAB — URINE MICROSCOPIC-ADD ON

## 2014-04-16 LAB — LIPASE, BLOOD: LIPASE: 60 U/L — AB (ref 11–59)

## 2014-04-16 MED ORDER — IOHEXOL 300 MG/ML  SOLN
50.0000 mL | Freq: Once | INTRAMUSCULAR | Status: AC | PRN
  Administered 2014-04-16: 50 mL via ORAL

## 2014-04-16 NOTE — ED Provider Notes (Signed)
CSN: [id number]     Arrival date & time 04/16/14  0018 History   First MD Initiated Contact with Patient 04/16/14 0042     Chief Complaint  Patient presents with  . Abdominal Pain     (Consider location/radiation/quality/duration/timing/severity/associated sxs/prior Treatment) Patient is a 80 y.o. female presenting with abdominal pain. The history is provided by the patient. No language interpreter was used.  Abdominal Pain Pain location:  RLQ Associated symptoms comment:  Patient is a poor historian due to dementia. Per her husband, she had sudden onset lower abdominal pain while getting ready for bed tonight. No vomiting or recent fever. He does not know when she had her last bowel movement. The patient currently denies pain and does not remember having pain earlier, c/w her baseline dementia according to her son, also at bedside.    Past Medical History  Diagnosis Date  . Dementia   . Arthritis   . Cancer     Skin   Past Surgical History  Procedure Laterality Date  . Abdominal hysterectomy    . Knee arthroscopy w/ meniscal repair    . Cataract extraction    . Cholecystectomy     No family history on file. History  Substance Use Topics  . Smoking status: Never Smoker   . Smokeless tobacco: Never Used  . Alcohol Use: No   OB History    No data available     Review of Systems  Unable to perform ROS: Dementia      Allergies  Thor-prom  Home Medications   Prior to Admission medications   Medication Sig Start Date End Date Taking? Authorizing Provider  Calcium-Vitamin D (CALTRATE 600 PLUS-VIT D PO) Take 1 tablet by mouth 2 (two) times daily.   Yes Historical Provider, MD  clopidogrel (PLAVIX) 75 MG tablet Take 1 tablet by mouth daily. 03/13/14  Yes Historical Provider, MD  memantine (NAMENDA) 5 MG tablet Take 1 tablet by mouth 3 (three) times daily.  02/25/14  Yes Historical Provider, MD  Multiple Vitamin (MULTIVITAMIN WITH MINERALS) TABS tablet Take 1 tablet by  mouth daily.   Yes Historical Provider, MD  TOPROL XL 25 MG 24 hr tablet Take 1 tablet by mouth daily. 04/08/14  Yes Historical Provider, MD   BP 176/99 mmHg  Pulse 61  Temp(Src) 97.4 F (36.3 C) (Oral)  Resp 18  SpO2 100% Physical Exam  Constitutional: She appears well-developed and well-nourished. No distress.  Well appearing, comfortable elderly female.  Eyes: Conjunctivae are normal.  Neck: Normal range of motion. Neck supple.  Cardiovascular: Normal rate.   No murmur heard. Pulmonary/Chest: Effort normal. She has no wheezes. She has no rales.  Abdominal: Soft. Bowel sounds are normal. She exhibits mass. There is tenderness.  RLQ abdominal tenderness with palpable mass more to midline abdomen that is less tender. Non-distended abdomen. BS positive.   Musculoskeletal:  Chronic bilateral lower extremity edema.   Neurological: She is alert. Coordination normal.  Skin: Skin is warm and dry.  Psychiatric: She has a normal mood and affect.    ED Course  Procedures (including critical care time) Labs Review Labs Reviewed  CBC WITH DIFFERENTIAL  COMPREHENSIVE METABOLIC PANEL  LIPASE, BLOOD  URINALYSIS, ROUTINE W REFLEX MICROSCOPIC   Results for orders placed or performed during the hospital encounter of 04/16/14  CBC with Differential  Result Value Ref Range   WBC 8.2 4.0 - 10.5 K/uL   RBC 4.45 3.87 - 5.11 MIL/uL   Hemoglobin 13.5 12.0 -  15.0 g/dL   HCT 39.3 36.0 - 46.0 %   MCV 88.3 78.0 - 100.0 fL   MCH 30.3 26.0 - 34.0 pg   MCHC 34.4 30.0 - 36.0 g/dL   RDW 13.9 11.5 - 15.5 %   Platelets 151 150 - 400 K/uL   Neutrophils Relative % 65 43 - 77 %   Neutro Abs 5.5 1.7 - 7.7 K/uL   Lymphocytes Relative 18 12 - 46 %   Lymphs Abs 1.5 0.7 - 4.0 K/uL   Monocytes Relative 13 (H) 3 - 12 %   Monocytes Absolute 1.0 0.1 - 1.0 K/uL   Eosinophils Relative 3 0 - 5 %   Eosinophils Absolute 0.2 0.0 - 0.7 K/uL   Basophils Relative 1 0 - 1 %   Basophils Absolute 0.1 0.0 - 0.1 K/uL   Comprehensive metabolic panel  Result Value Ref Range   Sodium 138 137 - 147 mEq/L   Potassium 3.9 3.7 - 5.3 mEq/L   Chloride 101 96 - 112 mEq/L   CO2 26 19 - 32 mEq/L   Glucose, Bld 108 (H) 70 - 99 mg/dL   BUN 23 6 - 23 mg/dL   Creatinine, Ser 1.28 (H) 0.50 - 1.10 mg/dL   Calcium 9.0 8.4 - 10.5 mg/dL   Total Protein 7.2 6.0 - 8.3 g/dL   Albumin 2.8 (L) 3.5 - 5.2 g/dL   AST 56 (H) 0 - 37 U/L   ALT 36 (H) 0 - 35 U/L   Alkaline Phosphatase 247 (H) 39 - 117 U/L   Total Bilirubin 0.4 0.3 - 1.2 mg/dL   GFR calc non Af Amer 36 (L) >90 mL/min   GFR calc Af Amer 41 (L) >90 mL/min   Anion gap 11 5 - 15  Lipase, blood  Result Value Ref Range   Lipase 60 (H) 11 - 59 U/L  Urinalysis, Routine w reflex microscopic  Result Value Ref Range   Color, Urine YELLOW YELLOW   APPearance CLOUDY (A) CLEAR   Specific Gravity, Urine 1.017 1.005 - 1.030   pH 5.5 5.0 - 8.0   Glucose, UA NEGATIVE NEGATIVE mg/dL   Hgb urine dipstick NEGATIVE NEGATIVE   Bilirubin Urine NEGATIVE NEGATIVE   Ketones, ur NEGATIVE NEGATIVE mg/dL   Protein, ur 30 (A) NEGATIVE mg/dL   Urobilinogen, UA 0.2 0.0 - 1.0 mg/dL   Nitrite NEGATIVE NEGATIVE   Leukocytes, UA SMALL (A) NEGATIVE  Urine microscopic-add on  Result Value Ref Range   Squamous Epithelial / LPF MANY (A) RARE   WBC, UA 3-6 <3 WBC/hpf   RBC / HPF 0-2 <3 RBC/hpf   Bacteria, UA MANY (A) RARE   Urine-Other MUCOUS PRESENT    Ct Abdomen Pelvis Wo Contrast  04/16/2014   CLINICAL DATA:  Acute onset right lower quadrant abdominal pain.  EXAM: CT ABDOMEN AND PELVIS WITHOUT CONTRAST  TECHNIQUE: Multidetector CT imaging of the abdomen and pelvis was performed following the standard protocol without IV contrast.  COMPARISON:  CT abdomen and pelvis 06/15/2004.  FINDINGS: Bibasilar fibrotic change is identified. There is no pleural or pericardial effusion. Heart size is normal.  The liver is shrunken with a nodular border consistent with cirrhosis. No focal liver lesion is  seen on this uninfused examination. The gallbladder has been removed. The adrenal glands and pancreas appear normal. A 1.2 cm low attenuating lesion in the spleen is identified on the prior study which may represent a hemangioma which was not visualized on the prior study due  to timing of scanning. Small bilateral renal cysts are seen.  The patient is status post hysterectomy. The stomach and small bowel are unremarkable. Diverticulosis without diverticulitis is identified. Aortoiliac atherosclerosis without aneurysm is seen. There is no lymphadenopathy or fluid.  No lytic or sclerotic bony lesion is seen. Convex right lumbar scoliosis and lower lumbar spondylosis are noted.  IMPRESSION: No acute finding abdomen or pelvis.  Cirrhosis.  Diverticulosis.   Electronically Signed   By: Inge Rise M.D.   On: 04/16/2014 03:19     Imaging Review No results found.   EKG Interpretation None      MDM   Final diagnoses:  None    1. Abdominal pain  VSS. The patient has not complained of pain since arrival, although she is tender to palpation in RLQ. CT negative for any acute intra-abdominal findings. Feel she can be discharged with close PCP recheck in the next 24 hours. Return precautions given to the family.    Dewaine Oats, PA-C 04/16/14 0345  Everlene Balls, MD 04/16/14 [phone]

## 2014-04-16 NOTE — ED Notes (Signed)
Bed: WA09 Expected date:  Expected time:  Means of arrival:  Comments: EMS/abd. pain 

## 2014-04-16 NOTE — Discharge Instructions (Signed)

## 2014-04-16 NOTE — ED Notes (Signed)
Pt is poor historian but states that she has felt "funny" the last few days. Pt denies pain while lying still but does have pain in BLQ with palpation. Denied any rebound tenderness. A lump was felt in the RLQ during palpation.

## 2014-04-16 NOTE — ED Notes (Signed)
Per EMS, pt is from home and c/o sudden onset RLQ ABD pain. Pain increased with palpation and movement. Pt denies ever having experienced this type of pain before. No n/v/d reported.

## 2014-08-17 ENCOUNTER — Encounter (HOSPITAL_COMMUNITY): Payer: Self-pay | Admitting: Oncology

## 2014-08-17 ENCOUNTER — Emergency Department (HOSPITAL_COMMUNITY)
Admission: EM | Admit: 2014-08-17 | Discharge: 2014-08-17 | Disposition: A | Discharge status: Home / Self Care | Payer: Medicare Other | Attending: Emergency Medicine | Admitting: Emergency Medicine

## 2014-08-17 ENCOUNTER — Emergency Department (HOSPITAL_COMMUNITY): Payer: Medicare Other

## 2014-08-17 DIAGNOSIS — R109 Unspecified abdominal pain: Secondary | ICD-10-CM | POA: Diagnosis present

## 2014-08-17 DIAGNOSIS — Z79899 Other long term (current) drug therapy: Secondary | ICD-10-CM | POA: Diagnosis not present

## 2014-08-17 DIAGNOSIS — Z8739 Personal history of other diseases of the musculoskeletal system and connective tissue: Secondary | ICD-10-CM | POA: Insufficient documentation

## 2014-08-17 DIAGNOSIS — F0390 Unspecified dementia without behavioral disturbance: Secondary | ICD-10-CM | POA: Insufficient documentation

## 2014-08-17 DIAGNOSIS — Z9049 Acquired absence of other specified parts of digestive tract: Secondary | ICD-10-CM | POA: Diagnosis not present

## 2014-08-17 DIAGNOSIS — Z90710 Acquired absence of both cervix and uterus: Secondary | ICD-10-CM | POA: Insufficient documentation

## 2014-08-17 DIAGNOSIS — Z85828 Personal history of other malignant neoplasm of skin: Secondary | ICD-10-CM | POA: Diagnosis not present

## 2014-08-17 DIAGNOSIS — Z7902 Long term (current) use of antithrombotics/antiplatelets: Secondary | ICD-10-CM | POA: Diagnosis not present

## 2014-08-17 DIAGNOSIS — R1010 Upper abdominal pain, unspecified: Secondary | ICD-10-CM | POA: Diagnosis not present

## 2014-08-17 DIAGNOSIS — R1110 Vomiting, unspecified: Secondary | ICD-10-CM | POA: Insufficient documentation

## 2014-08-17 LAB — CBC WITH DIFFERENTIAL/PLATELET
BASOS ABS: 0 10*3/uL (ref 0.0–0.1)
Basophils Relative: 1 % (ref 0–1)
EOS ABS: 0 10*3/uL (ref 0.0–0.7)
EOS PCT: 1 % (ref 0–5)
HCT: 41.6 % (ref 36.0–46.0)
Hemoglobin: 14.1 g/dL (ref 12.0–15.0)
Lymphocytes Relative: 12 % (ref 12–46)
Lymphs Abs: 0.9 10*3/uL (ref 0.7–4.0)
MCH: 30.7 pg (ref 26.0–34.0)
MCHC: 33.9 g/dL (ref 30.0–36.0)
MCV: 90.4 fL (ref 78.0–100.0)
Monocytes Absolute: 0.6 10*3/uL (ref 0.1–1.0)
Monocytes Relative: 8 % (ref 3–12)
Neutro Abs: 5.7 10*3/uL (ref 1.7–7.7)
Neutrophils Relative %: 78 % — ABNORMAL HIGH (ref 43–77)
PLATELETS: 152 10*3/uL (ref 150–400)
RBC: 4.6 MIL/uL (ref 3.87–5.11)
RDW: 14.1 % (ref 11.5–15.5)
WBC: 7.2 10*3/uL (ref 4.0–10.5)

## 2014-08-17 LAB — LIPASE, BLOOD: LIPASE: 41 U/L (ref 11–59)

## 2014-08-17 LAB — URINALYSIS, ROUTINE W REFLEX MICROSCOPIC
Bilirubin Urine: NEGATIVE
Glucose, UA: NEGATIVE mg/dL
Hgb urine dipstick: NEGATIVE
Ketones, ur: NEGATIVE mg/dL
LEUKOCYTES UA: NEGATIVE
Nitrite: NEGATIVE
PROTEIN: 100 mg/dL — AB
Specific Gravity, Urine: 1.015 (ref 1.005–1.030)
Urobilinogen, UA: 0.2 mg/dL (ref 0.0–1.0)
pH: 7 (ref 5.0–8.0)

## 2014-08-17 LAB — URINE MICROSCOPIC-ADD ON

## 2014-08-17 LAB — COMPREHENSIVE METABOLIC PANEL
ALT: 28 U/L (ref 0–35)
AST: 63 U/L — ABNORMAL HIGH (ref 0–37)
Albumin: 3.3 g/dL — ABNORMAL LOW (ref 3.5–5.2)
Alkaline Phosphatase: 187 U/L — ABNORMAL HIGH (ref 39–117)
Anion gap: 11 (ref 5–15)
BUN: 23 mg/dL (ref 6–23)
CO2: 23 mmol/L (ref 19–32)
Calcium: 8.8 mg/dL (ref 8.4–10.5)
Chloride: 102 mmol/L (ref 96–112)
Creatinine, Ser: 1.38 mg/dL — ABNORMAL HIGH (ref 0.50–1.10)
GFR, EST AFRICAN AMERICAN: 38 mL/min — AB (ref >90)
GFR, EST NON AFRICAN AMERICAN: 33 mL/min — AB (ref >90)
GLUCOSE: 114 mg/dL — AB (ref 70–99)
POTASSIUM: 4.5 mmol/L (ref 3.5–5.1)
SODIUM: 136 mmol/L (ref 135–145)
TOTAL PROTEIN: 7.1 g/dL (ref 6.0–8.3)
Total Bilirubin: 1.2 mg/dL (ref 0.3–1.2)

## 2014-08-17 LAB — I-STAT CG4 LACTIC ACID, ED
LACTIC ACID, VENOUS: 2.14 mmol/L — AB (ref 0.5–2.0)
Lactic Acid, Venous: 2.23 mmol/L (ref 0.5–2.0)

## 2014-08-17 LAB — I-STAT TROPONIN, ED: TROPONIN I, POC: 0.02 ng/mL (ref 0.00–0.08)

## 2014-08-17 MED ORDER — IOHEXOL 300 MG/ML  SOLN
50.0000 mL | Freq: Once | INTRAMUSCULAR | Status: AC | PRN
  Administered 2014-08-17: 50 mL via ORAL

## 2014-08-17 MED ORDER — HYDROCODONE-ACETAMINOPHEN 5-325 MG PO TABS: 1.0000 | ORAL_TABLET | ORAL | Status: AC | PRN

## 2014-08-17 MED ORDER — HYDROCODONE-ACETAMINOPHEN 5-325 MG PO TABS: 1.0000 | ORAL_TABLET | ORAL | Status: DC | PRN

## 2014-08-17 MED ORDER — ONDANSETRON HCL 4 MG PO TABS
4.0000 mg | ORAL_TABLET | Freq: Four times a day (QID) | ORAL | Status: AC
Start: 2014-08-17 — End: ?

## 2014-08-17 MED ORDER — ONDANSETRON HCL 4 MG PO TABS: 4.0000 mg | ORAL_TABLET | Freq: Four times a day (QID) | ORAL | Status: DC

## 2014-08-17 MED ORDER — FENTANYL CITRATE 0.05 MG/ML IJ SOLN
25.0000 ug | Freq: Once | INTRAMUSCULAR | Status: AC
  Administered 2014-08-17: 25 ug via INTRAVENOUS
  Filled 2014-08-17: qty 2

## 2014-08-17 MED ORDER — METOCLOPRAMIDE HCL 10 MG PO TABS: 10.0000 mg | ORAL_TABLET | Freq: Four times a day (QID) | ORAL | Status: DC

## 2014-08-17 NOTE — ED Notes (Signed)
PA given CG4 lactic results.

## 2014-08-17 NOTE — ED Notes (Addendum)
EKG given to EDP,Pfeiffer,MD., for review. 

## 2014-08-17 NOTE — ED Notes (Signed)
Per pt and family she began having diffuse sharp abdominal pain. Pt took tums and pepcid at home w/o relief.

## 2014-08-17 NOTE — Discharge Instructions (Signed)
Recommend that you take Norco as prescribed for pain as needed. Take Zofran as needed for nausea/vomiting. Follow-up with your primary doctor for a recheck of symptoms in 3 days. Be sure to drink plenty of fluids to prevent dehydration.  Abdominal Pain Many things can cause abdominal pain. Usually, abdominal pain is not caused by a disease and will improve without treatment. It can often be observed and treated at home. Your health care provider will do a physical exam and possibly order blood tests and X-rays to help determine the seriousness of your pain. However, in many cases, more time must pass before a clear cause of the pain can be found. Before that point, your health care provider may not know if you need more testing or further treatment. HOME CARE INSTRUCTIONS  Monitor your abdominal pain for any changes. The following actions may help to alleviate any discomfort you are experiencing:  Only take over-the-counter or prescription medicines as directed by your health care provider.  Do not take laxatives unless directed to do so by your health care provider.  Try a clear liquid diet (broth, tea, or water) as directed by your health care provider. Slowly move to a bland diet as tolerated. SEEK MEDICAL CARE IF:  You have unexplained abdominal pain.  You have abdominal pain associated with nausea or diarrhea.  You have pain when you urinate or have a bowel movement.  You experience abdominal pain that wakes you in the night.  You have abdominal pain that is worsened or improved by eating food.  You have abdominal pain that is worsened with eating fatty foods.  You have a fever. SEEK IMMEDIATE MEDICAL CARE IF:   Your pain does not go away within 2 hours.  You keep throwing up (vomiting).  Your pain is felt only in portions of the abdomen, such as the right side or the left lower portion of the abdomen.  You pass bloody or black tarry stools. MAKE SURE YOU:  Understand these  instructions.   Will watch your condition.   Will get help right away if you are not doing well or get worse.  Document Released: 03/01/2005 Document Revised: 05/27/2013 Document Reviewed: 01/29/2013 [facility] Patient Information 2015 [place], [place]. This information is not intended to replace advice given to you by your health care provider. Make sure you discuss any questions you have with your health care provider.

## 2014-08-17 NOTE — ED Notes (Signed)
EKG given to EDP Pfiefer for review

## 2014-08-17 NOTE — ED Provider Notes (Signed)
CSN: [id number]     Arrival date & time 08/17/14  0022 History   First MD Initiated Contact with Patient 08/17/14 0151     Chief Complaint  Patient presents with  . Abdominal Pain     (Consider location/radiation/quality/duration/timing/severity/associated sxs/prior Treatment) HPI Comments: Patient is a 81 year old female with a history of dementia and arthritis. She presents to the emergency department for further evaluation of abdominal pain. Abdominal pain is diffuse and sharp. It began at 2300 yesterday. Patient had one episode of vomiting, per her husband. She has not had any diarrhea or fever. Patient with a history of abdominal hysterectomy and cholecystectomy. Her family wonders if she had her appendix removed with her hysterectomy. No medications given prior to arrival. Level V caveat secondary to dementia.  Patient is a 81 y.o. female presenting with abdominal pain. The history is provided by the patient and a relative. No language interpreter was used.  Abdominal Pain Associated symptoms: vomiting     Past Medical History  Diagnosis Date  . Dementia   . Arthritis   . Cancer     Skin   Past Surgical History  Procedure Laterality Date  . Abdominal hysterectomy    . Knee arthroscopy w/ meniscal repair    . Cataract extraction    . Cholecystectomy     History reviewed. No pertinent family history. History  Substance Use Topics  . Smoking status: Never Smoker   . Smokeless tobacco: Never Used  . Alcohol Use: No   OB History    No data available      Review of Systems  Unable to perform ROS: Dementia  Gastrointestinal: Positive for vomiting and abdominal pain.    Allergies  Thor-prom  Home Medications   Prior to Admission medications   Medication Sig Start Date End Date Taking? Authorizing Provider  Calcium-Vitamin D (CALTRATE 600 PLUS-VIT D PO) Take 1 tablet by mouth 2 (two) times daily.   Yes Historical Provider, MD  clopidogrel (PLAVIX) 75 MG tablet  Take 75 mg by mouth daily.  03/13/14  Yes Historical Provider, MD  hydroxypropyl methylcellulose / hypromellose (ISOPTO TEARS / GONIOVISC) 2.5 % ophthalmic solution Place 1 drop into both eyes 3 (three) times daily as needed for dry eyes.   Yes Historical Provider, MD  memantine (NAMENDA) 5 MG tablet Take 1 tablet by mouth 3 (three) times daily.  02/25/14  Yes Historical Provider, MD  Multiple Vitamin (MULTIVITAMIN WITH MINERALS) TABS tablet Take 1 tablet by mouth daily.   Yes Historical Provider, MD  pyridOXINE (VITAMIN B-6) 100 MG tablet Take 100 mg by mouth daily.   Yes Historical Provider, MD  TOPROL XL 25 MG 24 hr tablet Take 1 tablet by mouth daily. 04/08/14  Yes Historical Provider, MD   BP 180/76 mmHg  Pulse 60  Temp(Src) 97.2 F (36.2 C) (Oral)  Resp 20  SpO2 96%   Physical Exam  Constitutional: She is oriented to person, place, and time. She appears well-developed and well-nourished. No distress.  Nontoxic/nonseptic appearing  HENT:  Head: Normocephalic and atraumatic.  Eyes: Conjunctivae and EOM are normal. No scleral icterus.  Neck: Normal range of motion.  Cardiovascular: Normal rate, regular rhythm and intact distal pulses.   Pulmonary/Chest: Effort normal and breath sounds normal. No respiratory distress. She has no wheezes. She has no rales.  Respirations even and unlabored  Abdominal: Soft. She exhibits no distension. There is tenderness. There is no rebound and no guarding.  Soft abdomen with tenderness to palpation  in the upper abdomen. No distention. No masses. No peritoneal signs noted.  Musculoskeletal: Normal range of motion.  Neurological: She is alert and oriented to person, place, and time.  GCS 15. Patient moves extremities without ataxia.  Skin: Skin is warm and dry. No rash noted. She is not diaphoretic. No erythema. No pallor.  Psychiatric: She has a normal mood and affect. Her behavior is normal.  Nursing note and vitals reviewed.   ED Course  Procedures  (including critical care time) Labs Review Labs Reviewed  CBC WITH DIFFERENTIAL/PLATELET - Abnormal; Notable for the following:    Neutrophils Relative % 78 (*)    All other components within normal limits  COMPREHENSIVE METABOLIC PANEL - Abnormal; Notable for the following:    Glucose, Bld 114 (*)    Creatinine, Ser 1.38 (*)    Albumin 3.3 (*)    AST 63 (*)    Alkaline Phosphatase 187 (*)    GFR calc non Af Amer 33 (*)    GFR calc Af Amer 38 (*)    All other components within normal limits  URINALYSIS, ROUTINE W REFLEX MICROSCOPIC - Abnormal; Notable for the following:    Protein, ur 100 (*)    All other components within normal limits  URINE MICROSCOPIC-ADD ON - Abnormal; Notable for the following:    Casts HYALINE CASTS (*)    All other components within normal limits  I-STAT CG4 LACTIC ACID, ED - Abnormal; Notable for the following:    Lactic Acid, Venous 2.14 (*)    All other components within normal limits  I-STAT CG4 LACTIC ACID, ED - Abnormal; Notable for the following:    Lactic Acid, Venous 2.23 (*)    All other components within normal limits  LIPASE, BLOOD  I-STAT TROPOININ, ED    Imaging Review Ct Abdomen Pelvis Wo Contrast  08/17/2014   CLINICAL DATA:  Diffuse sharp abdominal and back pain this morning. History of cholecystectomy and hysterectomy.  EXAM: CT ABDOMEN AND PELVIS WITHOUT CONTRAST  TECHNIQUE: Multidetector CT imaging of the abdomen and pelvis was performed following the standard protocol without IV contrast. Oral contrast was administered appearing  COMPARISON:  CT of the abdomen pelvis April 16, 2014  FINDINGS: LUNG BASES: Fibrotic changes lung bases, present on prior imaging without pleural effusion. The heart appears mildly enlarged, Coronary artery calcifications noted. No pericardial fluid collections.  KIDNEYS/BLADDER: Kidneys are orthotopic, demonstrating normal size and morphology. No nephrolithiasis, hydronephrosis; limited assessment for renal  masses on this nonenhanced examination. 2 cm exophytic LEFT interpolar, LEFT lower lobe 2.2 cm RIGHT cyst. Additional smaller cysts in the kidneys bilaterally, followup which were present on prior examination including subcentimeter dense indeterminate RIGHT interpolar lesion. The unopacified ureters are normal in course and caliber. Urinary bladder is well distended containing a bubble of nondependent gas.  SOLID ORGANS: The liver is small, diffusely nodular consistent with cirrhosis. No splenomegaly. Adrenal glands and pancreas are normal in appearing for this nonenhanced examination. Status post cholecystectomy.  GASTROINTESTINAL TRACT: A few loops of nondilated small bowel in central abdomen with wall thickening. The stomach, large bowel are normal in course and caliber without inflammatory changes, the sensitivity may be decreased by lack of enteric contrast. Moderate sigmoid diverticulosis. Normal appendix.  PERITONEUM/RETROPERITONEUM: Aortoiliac vessels are normal in course and caliber, severe calcific atherosclerosis. No lymphadenopathy by CT size criteria. Internal reproductive organs are unremarkable. No intraperitoneal free fluid nor free air.  SOFT TISSUES/ OSSEOUS STRUCTURES: Nonsuspicious. Small fat containing LEFT inguinal hernia.  Severe L4-5 and L5-S1 degenerative discs.  IMPRESSION: Mild central abdominal small bowel wall thickening may be infectious or inflammatory, less likely ischemic without bowel obstruction.  Bubble of gas in the urinary bladder, recommend correlation with prior instrumentation.  Cirrhosis.  Diverticulosis.   Electronically Signed   By: Elon Alas   On: 08/17/2014 06:06     EKG Interpretation   Date/Time:  Monday August 17 2014 01:20:36 EDT Ventricular Rate:  48 PR Interval:  199 QRS Duration: 90 QT Interval:  472 QTC Calculation: 422 R Axis:   -18 Text Interpretation:  Sinus bradycardia Borderline left axis deviation Low  voltage, precordial leads Probable  anteroseptal infarct, old since last  ekg rate is slower Confirmed by OTTER  MD, OLGA ([id number]) on 08/17/2014  4:46:58 AM      MDM   Final diagnoses:  Abdominal pain    81 year old female presents to the emergency department for further evaluation of abdominal pain which began at 2300. Patient is nontoxic and nonseptic appearing as well as hemodynamically stable and afebrile. Patient has no leukocytosis, anemia, or electrolyte imbalance. Her kidney function and liver function are at baseline. Urinalysis today without evidence of infection. CT abdomen pelvis obtained for further evaluation of symptoms. This shows some mild central abdominal small bowel wall thickening.   Given that patient has no other symptoms of infectious process, suspect that changes may be inflammatory. Patient has had a stable lactate and a history of an elevated lactic acid. Believe she is stable for outpatient management with Zofran and pain medication. Have advised the patient follow-up with her primary care doctor in 3 days for a recheck of symptoms. Patient seen also by Dr. Sharol Given who is in agreement with this workup, assessment, management plan, and patient's stability for discharge.   Filed Vitals:   08/17/14 0058 08/17/14 0328 08/17/14 0535  BP: 192/96 204/76 180/76  Pulse: 49 60 60  Temp: 97.5 F (36.4 C) 97.2 F (36.2 C)   TempSrc: Oral Oral   Resp: 20 19 20   SpO2: 98% 100% 96%     Antonietta Breach, PA-C 08/17/14 [place], MD 08/17/14 [phone]

## 2014-08-18 ENCOUNTER — Telehealth (HOSPITAL_BASED_OUTPATIENT_CLINIC_OR_DEPARTMENT_OTHER): Payer: Self-pay | Admitting: Emergency Medicine

## 2014-09-04 ENCOUNTER — Encounter (HOSPITAL_COMMUNITY): Payer: Self-pay

## 2014-09-04 ENCOUNTER — Inpatient Hospital Stay (HOSPITAL_COMMUNITY)
Admission: EM | Admit: 2014-09-04 | Discharge: 2014-10-04 | DRG: 061 | Disposition: E | Payer: Medicare Other | Attending: Neurology | Admitting: Neurology

## 2014-09-04 ENCOUNTER — Emergency Department (HOSPITAL_COMMUNITY): Payer: Medicare Other

## 2014-09-04 DIAGNOSIS — G8104 Flaccid hemiplegia affecting left nondominant side: Secondary | ICD-10-CM | POA: Diagnosis present

## 2014-09-04 DIAGNOSIS — Z23 Encounter for immunization: Secondary | ICD-10-CM | POA: Diagnosis not present

## 2014-09-04 DIAGNOSIS — Z7902 Long term (current) use of antithrombotics/antiplatelets: Secondary | ICD-10-CM | POA: Diagnosis not present

## 2014-09-04 DIAGNOSIS — R471 Dysarthria and anarthria: Secondary | ICD-10-CM | POA: Diagnosis present

## 2014-09-04 DIAGNOSIS — J9690 Respiratory failure, unspecified, unspecified whether with hypoxia or hypercapnia: Secondary | ICD-10-CM | POA: Diagnosis not present

## 2014-09-04 DIAGNOSIS — I509 Heart failure, unspecified: Secondary | ICD-10-CM

## 2014-09-04 DIAGNOSIS — I2510 Atherosclerotic heart disease of native coronary artery without angina pectoris: Secondary | ICD-10-CM | POA: Diagnosis present

## 2014-09-04 DIAGNOSIS — I6789 Other cerebrovascular disease: Secondary | ICD-10-CM | POA: Diagnosis not present

## 2014-09-04 DIAGNOSIS — I63411 Cerebral infarction due to embolism of right middle cerebral artery: Secondary | ICD-10-CM | POA: Diagnosis not present

## 2014-09-04 DIAGNOSIS — I4891 Unspecified atrial fibrillation: Secondary | ICD-10-CM | POA: Diagnosis present

## 2014-09-04 DIAGNOSIS — E119 Type 2 diabetes mellitus without complications: Secondary | ICD-10-CM | POA: Diagnosis present

## 2014-09-04 DIAGNOSIS — I63311 Cerebral infarction due to thrombosis of right middle cerebral artery: Secondary | ICD-10-CM | POA: Diagnosis present

## 2014-09-04 DIAGNOSIS — R531 Weakness: Secondary | ICD-10-CM | POA: Diagnosis present

## 2014-09-04 DIAGNOSIS — R1310 Dysphagia, unspecified: Secondary | ICD-10-CM | POA: Diagnosis present

## 2014-09-04 DIAGNOSIS — E785 Hyperlipidemia, unspecified: Secondary | ICD-10-CM | POA: Diagnosis present

## 2014-09-04 DIAGNOSIS — G936 Cerebral edema: Secondary | ICD-10-CM | POA: Diagnosis not present

## 2014-09-04 DIAGNOSIS — F0390 Unspecified dementia without behavioral disturbance: Secondary | ICD-10-CM | POA: Diagnosis present

## 2014-09-04 DIAGNOSIS — Z66 Do not resuscitate: Secondary | ICD-10-CM | POA: Diagnosis present

## 2014-09-04 DIAGNOSIS — R29810 Facial weakness: Secondary | ICD-10-CM | POA: Diagnosis present

## 2014-09-04 DIAGNOSIS — I639 Cerebral infarction, unspecified: Secondary | ICD-10-CM | POA: Diagnosis not present

## 2014-09-04 DIAGNOSIS — G935 Compression of brain: Secondary | ICD-10-CM | POA: Diagnosis not present

## 2014-09-04 DIAGNOSIS — I63319 Cerebral infarction due to thrombosis of unspecified middle cerebral artery: Secondary | ICD-10-CM | POA: Diagnosis not present

## 2014-09-04 DIAGNOSIS — I10 Essential (primary) hypertension: Secondary | ICD-10-CM | POA: Diagnosis present

## 2014-09-04 DIAGNOSIS — Z515 Encounter for palliative care: Secondary | ICD-10-CM | POA: Diagnosis not present

## 2014-09-04 LAB — DIFFERENTIAL
BASOS PCT: 1 % (ref 0–1)
Basophils Absolute: 0.1 10*3/uL (ref 0.0–0.1)
EOS PCT: 5 % (ref 0–5)
Eosinophils Absolute: 0.4 10*3/uL (ref 0.0–0.7)
Lymphocytes Relative: 31 % (ref 12–46)
Lymphs Abs: 2.9 10*3/uL (ref 0.7–4.0)
MONO ABS: 1.6 10*3/uL — AB (ref 0.1–1.0)
Monocytes Relative: 17 % — ABNORMAL HIGH (ref 3–12)
NEUTROS ABS: 4.3 10*3/uL (ref 1.7–7.7)
Neutrophils Relative %: 46 % (ref 43–77)

## 2014-09-04 LAB — COMPREHENSIVE METABOLIC PANEL
ALT: 28 U/L (ref 0–35)
ANION GAP: 11 (ref 5–15)
AST: 49 U/L — AB (ref 0–37)
Albumin: 2.8 g/dL — ABNORMAL LOW (ref 3.5–5.2)
Alkaline Phosphatase: 192 U/L — ABNORMAL HIGH (ref 39–117)
BUN: 18 mg/dL (ref 6–23)
CHLORIDE: 105 mmol/L (ref 96–112)
CO2: 22 mmol/L (ref 19–32)
Calcium: 8.6 mg/dL (ref 8.4–10.5)
Creatinine, Ser: 1.22 mg/dL — ABNORMAL HIGH (ref 0.50–1.10)
GFR, EST AFRICAN AMERICAN: 44 mL/min — AB (ref >90)
GFR, EST NON AFRICAN AMERICAN: 38 mL/min — AB (ref >90)
Glucose, Bld: 118 mg/dL — ABNORMAL HIGH (ref 70–99)
POTASSIUM: 3.6 mmol/L (ref 3.5–5.1)
SODIUM: 138 mmol/L (ref 135–145)
TOTAL PROTEIN: 6.3 g/dL (ref 6.0–8.3)
Total Bilirubin: 1.1 mg/dL (ref 0.3–1.2)

## 2014-09-04 LAB — APTT: APTT: 30 s (ref 24–37)

## 2014-09-04 LAB — PROTIME-INR
INR: 1.11 (ref 0.00–1.49)
PROTHROMBIN TIME: 14.4 s (ref 11.6–15.2)

## 2014-09-04 LAB — I-STAT TROPONIN, ED: TROPONIN I, POC: 0 ng/mL (ref 0.00–0.08)

## 2014-09-04 LAB — MRSA PCR SCREENING: MRSA by PCR: NEGATIVE

## 2014-09-04 LAB — CBC
HEMATOCRIT: 38.6 % (ref 36.0–46.0)
HEMOGLOBIN: 12.9 g/dL (ref 12.0–15.0)
MCH: 29.8 pg (ref 26.0–34.0)
MCHC: 33.4 g/dL (ref 30.0–36.0)
MCV: 89.1 fL (ref 78.0–100.0)
PLATELETS: 213 10*3/uL (ref 150–400)
RBC: 4.33 MIL/uL (ref 3.87–5.11)
RDW: 14.3 % (ref 11.5–15.5)
WBC: 9.3 10*3/uL (ref 4.0–10.5)

## 2014-09-04 MED ORDER — ONDANSETRON HCL 4 MG/2ML IJ SOLN: 4.0000 mg | Freq: Four times a day (QID) | INTRAMUSCULAR | Status: DC | PRN

## 2014-09-04 MED ORDER — STROKE: EARLY STAGES OF RECOVERY BOOK
Freq: Once | Status: AC
  Administered 2014-09-05: 21:00:00
  Filled 2014-09-04: qty 1

## 2014-09-04 MED ORDER — ACETAMINOPHEN 650 MG RE SUPP
650.0000 mg | RECTAL | Status: DC | PRN
  Administered 2014-09-05 – 2014-09-07 (×3): 650 mg via RECTAL
  Filled 2014-09-04 (×3): qty 1

## 2014-09-04 MED ORDER — LABETALOL HCL 5 MG/ML IV SOLN
10.0000 mg | INTRAVENOUS | Status: DC | PRN
  Administered 2014-09-04 – 2014-09-06 (×5): 10 mg via INTRAVENOUS
  Filled 2014-09-04 (×3): qty 4

## 2014-09-04 MED ORDER — ALTEPLASE (STROKE) FULL DOSE INFUSION
56.0000 mg | INTRAVENOUS | Status: AC
  Administered 2014-09-04: 56 mg via INTRAVENOUS
  Filled 2014-09-04: qty 56

## 2014-09-04 MED ORDER — ONDANSETRON HCL 4 MG/2ML IJ SOLN
4.0000 mg | Freq: Once | INTRAMUSCULAR | Status: AC
  Administered 2014-09-04: 4 mg via INTRAVENOUS
  Filled 2014-09-04: qty 2

## 2014-09-04 MED ORDER — SENNOSIDES-DOCUSATE SODIUM 8.6-50 MG PO TABS
1.0000 | ORAL_TABLET | Freq: Every evening | ORAL | Status: DC | PRN
  Filled 2014-09-04: qty 1

## 2014-09-04 MED ORDER — ACETAMINOPHEN 325 MG PO TABS: 650.0000 mg | ORAL_TABLET | ORAL | Status: DC | PRN

## 2014-09-04 MED ORDER — SODIUM CHLORIDE 0.9 % IV SOLN
INTRAVENOUS | Status: DC
  Administered 2014-09-04 – 2014-09-07 (×5): via INTRAVENOUS

## 2014-09-04 MED ORDER — PANTOPRAZOLE SODIUM 40 MG IV SOLR
40.0000 mg | Freq: Every day | INTRAVENOUS | Status: DC
  Administered 2014-09-04 – 2014-09-06 (×3): 40 mg via INTRAVENOUS
  Filled 2014-09-04 (×6): qty 40

## 2014-09-04 NOTE — ED Provider Notes (Signed)
CSN: [id number]     Arrival date & time 09/19/2014  1059 History   First MD Initiated Contact with Patient 09/05/2014 1104     Chief Complaint  Patient presents with  . Code Stroke     (Consider location/radiation/quality/duration/timing/severity/associated sxs/prior Treatment) Patient is a 81 y.o. female presenting with weakness. The history is provided by the spouse and the EMS personnel. The history is limited by the condition of the patient.  Weakness This is a new (History given by patient's spouse who states that at 2 today she developed flaccid paralysis of her left side and face) problem. The current episode started less than 1 hour ago. The problem occurs constantly. The problem has not changed since onset.Associated symptoms comments: Left-sided facial droop, left upper and lower extremity weakness.  . Nothing aggravates the symptoms. Nothing relieves the symptoms. She has tried nothing for the symptoms.    Past Medical History  Diagnosis Date  . Dementia   . Arthritis   . Cancer     Skin   Past Surgical History  Procedure Laterality Date  . Abdominal hysterectomy    . Knee arthroscopy w/ meniscal repair    . Cataract extraction    . Cholecystectomy     No family history on file. History  Substance Use Topics  . Smoking status: Never Smoker   . Smokeless tobacco: Never Used  . Alcohol Use: No   OB History    No data available     Review of Systems  Unable to perform ROS Neurological: Positive for weakness.      Allergies  Thor-prom  Home Medications   Prior to Admission medications   Medication Sig Start Date End Date Taking? Authorizing Provider  Calcium-Vitamin D (CALTRATE 600 PLUS-VIT D PO) Take 1 tablet by mouth 2 (two) times daily.    Historical Provider, MD  clopidogrel (PLAVIX) 75 MG tablet Take 75 mg by mouth daily.  03/13/14   Historical Provider, MD  HYDROcodone-acetaminophen (NORCO/VICODIN) 5-325 MG per tablet Take 1 tablet by mouth every 4  (four) hours as needed for moderate pain or severe pain (Every 4-6 hours as needed). 08/17/14   Antonietta Breach, PA-C  hydroxypropyl methylcellulose / hypromellose (ISOPTO TEARS / GONIOVISC) 2.5 % ophthalmic solution Place 1 drop into both eyes 3 (three) times daily as needed for dry eyes.    Historical Provider, MD  memantine (NAMENDA) 5 MG tablet Take 1 tablet by mouth 3 (three) times daily.  02/25/14   Historical Provider, MD  Multiple Vitamin (MULTIVITAMIN WITH MINERALS) TABS tablet Take 1 tablet by mouth daily.    Historical Provider, MD  ondansetron (ZOFRAN) 4 MG tablet Take 1 tablet (4 mg total) by mouth every 6 (six) hours. 08/17/14   Antonietta Breach, PA-C  pyridOXINE (VITAMIN B-6) 100 MG tablet Take 100 mg by mouth daily.    Historical Provider, MD  TOPROL XL 25 MG 24 hr tablet Take 1 tablet by mouth daily. 04/08/14   Historical Provider, MD   There were no vitals taken for this visit. Physical Exam  Constitutional: She is oriented to person, place, and time. She appears well-developed and well-nourished. No distress.  HENT:  Head: Normocephalic and atraumatic.  Mouth/Throat: Oropharynx is clear and moist.  Eyes: Conjunctivae and EOM are normal. Pupils are equal, round, and reactive to light.  Neck: Normal range of motion. Neck supple.  Cardiovascular: Normal rate, regular rhythm and intact distal pulses.   No murmur heard. Pulmonary/Chest: Effort normal. No respiratory distress.  She has no wheezes. She has rales.  Mild diffuse rales  Abdominal: Soft. She exhibits no distension. There is no tenderness. There is no rebound and no guarding.  Musculoskeletal: Normal range of motion. She exhibits no edema or tenderness.  Neurological: She is alert and oriented to person, place, and time. A cranial nerve deficit and sensory deficit is present.  Flaccid paralysis of the left upper and lower extremity. Left-sided facial droop. Right-sided gaze. Patient unable to name objects on a page.  Skin: Skin is  warm and dry. No rash noted. No erythema.  Psychiatric: She has a normal mood and affect. Her behavior is normal.  Nursing note and vitals reviewed.   ED Course  Procedures (including critical care time) Labs Review Labs Reviewed  DIFFERENTIAL - Abnormal; Notable for the following:    Monocytes Relative 17 (*)    Monocytes Absolute 1.6 (*)    All other components within normal limits  COMPREHENSIVE METABOLIC PANEL - Abnormal; Notable for the following:    Glucose, Bld 118 (*)    Creatinine, Ser 1.22 (*)    Albumin 2.8 (*)    AST 49 (*)    Alkaline Phosphatase 192 (*)    GFR calc non Af Amer 38 (*)    GFR calc Af Amer 44 (*)    All other components within normal limits  PROTIME-INR  APTT  CBC  I-STAT TROPOININ, ED  CBG MONITORING, ED    Imaging Review Ct Head (brain) Wo Contrast  10/03/2014   CLINICAL DATA:  Left facial droop and flaccid missed.  EXAM: CT HEAD WITHOUT CONTRAST  TECHNIQUE: Contiguous axial images were obtained from the base of the skull through the vertex without intravenous contrast.  COMPARISON:  10/19/2009  FINDINGS: Brain: Hyperdense right M1, from the carotid terminus to the lower sylvian fissure. Low-density right caudate head and putamen and indistinct insular ribbon. No clear cortical infarct outside of the insula. This correlates with ASPECTS of 7. No intracranial hemorrhage.  Advanced brain atrophy with ventriculomegaly. Prominent mesial temporal lobe involvement suggest Alzheimer's disease in this patient with history of dementia. Chronic small-vessel disease with ischemic gliosis around the lateral ventricles and small remote lacunar infarct in the upper left cerebellum.  Skull and Sinuses:Negative for fracture or destructive process. The mastoids, middle ears, and imaged paranasal sinuses are clear.  Orbits: Gaze deviation to the right.  Critical Value/emergent results were called by telephone at the time of interpretation on 09/27/2014 at 11:19 am to Dr.  Doy Mince, who verbally acknowledged these results.  IMPRESSION: 1. Right M1 thrombosis with probable early infarction in the right caudate, putamen, and insula. 2. Brain atrophy, as above.   Electronically Signed   By: Monte Fantasia M.D.   On: 09/23/2014 11:21     EKG Interpretation None      MDM   Final diagnoses:  Cerebral infarction due to thrombosis of middle cerebral artery    Patient presenting as a code stroke today. Last seen normal about 10:00 this morning when she suddenly developed left-sided paralysis facial droop per husband who was present. On exam patient has significant right sided gaze, flaccid paralysis of the left upper, lower extremity and left facial droop. Patient does have a history of dementia but is functional at home with her walker.  Patient had code stroke labs ordered and CT showed a right M1 thrombosis with early infarction in the right caudate putamen and insula. She is a candidate for TPA and it was initiated. Patient  currently has some mild rhonchi with breath sounds present has a patent airway. Does not require intubation at this time.  11:47 AM   CRITICAL CARE Performed by: Blanchie Dessert Total critical care time: 30 Critical care time was exclusive of separately billable procedures and treating other patients. Critical care was necessary to treat or prevent imminent or life-threatening deterioration. Critical care was time spent personally by me on the following activities: development of treatment plan with patient and/or surrogate as well as nursing, discussions with consultants, evaluation of patient's response to treatment, examination of patient, obtaining history from patient or surrogate, ordering and performing treatments and interventions, ordering and review of laboratory studies, ordering and review of radiographic studies, pulse oximetry and re-evaluation of patient's condition. c  Blanchie Dessert, MD 10/01/2014 [phone]

## 2014-09-04 NOTE — Code Documentation (Signed)
81yo female arriving to [facility] via [place] at 63.  EMS reports that the patient had sudden onset left facial droop and left sided weakness at 1020.  Patient was at home with her husband and son at this time.  Patient had walked into the kitchen with her walker per usual and sat down to eat breakfast.  Patient's husband made her breakfast and then came back to check on her and found her to have left sided weakness.  EMS called and activated Code Stroke.  Stroke team at the bedside on arrival.  Labs drawn and patient cleared by Dr. Mingo Amber.  Patient to CT.  CT showing right M1 thrombosis.  NIHSS 21, see documentation for details and code stroke times. Patient with right gaze, left facial droop, left hemiplegia, dysarthria and left neglect.  Patient vomited x1. Dr. Doy Mince at the bedside.  Decision made to treat with tPA and plan of care discussed with patient's husband.  Pharmacy notified to mix tPA at 1123, tPA delivered to the bedside at 1131. Patient restless during automatic BP reads, BP taken manually per MD request.  See documentation for manual BPs and vitals.  Unsuccessful attempt made to place foley catheter per MD request, order to start tPA without placing foley catheter.  tPA 6mg  bolus given at 1137 over 1 minute followed by 50mg /hr per pharmacy.  Patient assessed frequently per post tPA protocol.  Patient began vomiting and tPA stopped at 1203.  Dr. Doy Mince aware, order for Zofran 4mg  IVP and STAT CT.  Zofran given per order and patient to CT with ED RN Martie Round and Stroke RN.  Patient back from CT and Dr. Doy Mince made aware that CT completed, current BP, and patient c/o headache and eye pain.  Order to restart tPA, tPA restarted at 1228.  Per family patient c/o eye pain at home frequently d/t chronic dry eye.  Patient with another episode of vomiting, MD aware.  Patient to be admitted to neuro ICU, patient transported to 67M04 by ED RN and Stroke RN.  Bedside handoff with 67M RN Eddie Dibbles.

## 2014-09-04 NOTE — H&P (Addendum)
Admission H&P    Chief Complaint: Left sided weakness, facial droop HPI: Megan Ho is an 81 y.o. female who at baseline has a dementia and walks with a walker. Was able to get up this morning and eat breakfast.  Her husband went to the bathroom and when he returned she was slumped over.  EMS was called at that time and the patient was brought in as a code stroke.   At baseline sone reports that patient has severe dementia.  Is able to walk with a walker.  Dresses herself and is continent.  Does not cook but can feed herself.    Date last known well: Date: 09/25/2014 Time last known well: Time: 10:00 tPA Given: Yes Pre-admission mRankin: 3  Past Medical History  Diagnosis Date  . Dementia   . Arthritis   . Cancer     Skin    Past Surgical History  Procedure Laterality Date  . Abdominal hysterectomy    . Knee arthroscopy w/ meniscal repair    . Cataract extraction    . Cholecystectomy      Family history: Mother died of old age.  Father died of pneumonia.  Had 12 siblings.  Most have passed away of cancer and old age.  No details as to type of cancer.  Social History:  reports that she has never smoked. She has never used smokeless tobacco. She reports that she does not drink alcohol or use illicit drugs.  Allergies:  Allergies  Allergen Reactions  . Hydrocodone     unknown  . Thor-Prom [Chlorpromazine]     "turned yellow"    Medications: Current outpatient prescriptions:  .  Calcium-Vitamin D (CALTRATE 600 PLUS-VIT D PO), Take 1 tablet by mouth 2 (two) times daily., Disp: , Rfl:  .  clopidogrel (PLAVIX) 75 MG tablet, Take 75 mg by mouth daily. , Disp: , Rfl:  .  hydroxypropyl methylcellulose / hypromellose (ISOPTO TEARS / GONIOVISC) 2.5 % ophthalmic solution, Place 1 drop into both eyes 3 (three) times daily as needed for dry eyes., Disp: , Rfl:  .  Ibuprofen 200 MG CAPS, Take 200-400 mg by mouth 2 (two) times daily., Disp: , Rfl:  .  memantine (NAMENDA) 5 MG tablet,  Take 1 tablet by mouth 3 (three) times daily. , Disp: , Rfl:  .  Multiple Vitamin (MULTIVITAMIN WITH MINERALS) TABS tablet, Take 1 tablet by mouth daily., Disp: , Rfl:  .  pyridOXINE (VITAMIN B-6) 100 MG tablet, Take 100 mg by mouth daily., Disp: , Rfl:  .  TOPROL XL 25 MG 24 hr tablet, Take 1 tablet by mouth daily., Disp: , Rfl:  .  HYDROcodone-acetaminophen (NORCO/VICODIN) 5-325 MG per tablet, Take 1 tablet by mouth every 4 (four) hours as needed for moderate pain or severe pain (Every 4-6 hours as needed). (Patient not taking: Reported on 09/08/2014), Disp: 13 tablet, Rfl: 0 .  ondansetron (ZOFRAN) 4 MG tablet, Take 1 tablet (4 mg total) by mouth every 6 (six) hours. (Patient not taking: Reported on 09/05/2014), Disp: 12 tablet, Rfl: 0 .  [DISCONTINUED] metoCLOPramide (REGLAN) 10 MG tablet, Take 1 tablet (10 mg total) by mouth every 6 (six) hours., Disp: 30 tablet, Rfl: 0  ROS: History obtained from the patient  General ROS: negative for - chills, fatigue, fever, night sweats, weight gain or weight loss Psychological ROS: negative for - behavioral disorder, hallucinations, memory difficulties, mood swings or suicidal ideation Ophthalmic ROS: negative for - blurry vision, double vision, eye pain  or loss of vision ENT ROS: negative for - epistaxis, nasal discharge, oral lesions, sore throat, tinnitus or vertigo Allergy and Immunology ROS: negative for - hives or itchy/watery eyes Hematological and Lymphatic ROS: negative for - bleeding problems, bruising or swollen lymph nodes Endocrine ROS: negative for - galactorrhea, hair pattern changes, polydipsia/polyuria or temperature intolerance Respiratory ROS: negative for - cough, hemoptysis, shortness of breath or wheezing Cardiovascular ROS: negative for - chest pain, dyspnea on exertion, edema or irregular heartbeat Gastrointestinal ROS: negative for - abdominal pain, diarrhea, hematemesis, nausea/vomiting or stool incontinence Genito-Urinary ROS:  negative for - dysuria, hematuria, incontinence or urinary frequency/urgency Musculoskeletal ROS: negative for - joint swelling or muscular weakness Neurological ROS: as noted in HPI Dermatological ROS: negative for rash and skin lesion changes  Physical Examination: Blood pressure 178/98, pulse 60, temperature 96.7 F (35.9 C), resp. rate 27, SpO2 98 %.  General Examination: HEENT-  Normocephalic, no lesions, without obvious abnormality.  Normal external eye and conjunctiva.  Normal TM's bilaterally.  Normal auditory canals and external ears. Normal external nose, mucus membranes and septum.  Normal pharynx. Cardiovascular- S1, S2 normal, pulses palpable throughout   Lungs- chest clear, no wheezing, rales, normal symmetric air entry Abdomen- soft, non-tender; bowel sounds normal; no masses,  no organomegaly Extremities- mild bilateral lower extremity edema Lymph-no adenopathy palpable Musculoskeletal-no joint tenderness, deformity or swelling Skin-eccymoses on RUE  Neurological Examination Mental Status: Lethargic but arousable.  Answers are short but appropriate.  Not fully oriented.  Follows simple commands.  Left neglect. . Cranial Nerves: II: Discs flat bilaterally; Blinks to confrotation from the right but not from the left, pupils equal, round, reactive to light and accommodation III,IV, VI: ptosis not present, right gaze deviation V,VII: laft facial droop, facial light touch sensation normal bilaterally VIII: hearing normal bilaterally IX,X: gag reflex reduced XI: decreased left shoulder shrug XII: unable to test Motor: Good strength on the right.  Moves the left but only to painful stimulation Sensory: Responds to noxious stimuli throughout Deep Tendon Reflexes: 2+ throughout with an absent left AJ Plantars: Right: mute   Left: upgoing Cerebellar: Finger to nose intact on the right Gait: unable to test due to safety concerns.     Laboratory Studies:   Basic  Metabolic Panel:  Recent Labs Lab 09/21/2014 1104  NA 138  K 3.6  CL 105  CO2 22  GLUCOSE 118*  BUN 18  CREATININE 1.22*  CALCIUM 8.6    Liver Function Tests:  Recent Labs Lab 09/07/2014 1104  AST 49*  ALT 28  ALKPHOS 192*  BILITOT 1.1  PROT 6.3  ALBUMIN 2.8*   No results for input(s): LIPASE, AMYLASE in the last 168 hours. No results for input(s): AMMONIA in the last 168 hours.  CBC:  Recent Labs Lab 09/24/2014 1104  WBC 9.3  NEUTROABS 4.3  HGB 12.9  HCT 38.6  MCV 89.1  PLT 213    Cardiac Enzymes: No results for input(s): CKTOTAL, CKMB, CKMBINDEX, TROPONINI in the last 168 hours.  BNP: Invalid input(s): POCBNP  CBG: No results for input(s): GLUCAP in the last 168 hours.  Microbiology: Results for orders placed or performed during the hospital encounter of 10/13/09  Urine culture     Status: None   Collection Time: 10/13/09  6:23 AM  Result Value Ref Range Status   Specimen Description URINE, CATHETERIZED  Final   Special Requests NONE  Final   Colony Count >=100,000 COLONIES/ML  Final   Culture ESCHERICHIA COLI  Final   Report Status 10/15/2009 FINAL  Final   Organism ID, Bacteria ESCHERICHIA COLI  Final      Susceptibility   Escherichia coli - MIC    AMPICILLIN <=2 Sensitive     CEFAZOLIN <=4 Sensitive     CEFTRIAXONE <=1 Sensitive     CIPROFLOXACIN <=0.25 Sensitive     GENTAMICIN <=1 Sensitive     LEVOFLOXACIN <=0.12 Sensitive     NITROFURANTOIN 128 Resistant     TOBRAMYCIN <=1 Sensitive     TRIMETH/SULFA <=20 Sensitive   CSF cell count with differential     Status: Abnormal   Collection Time: 10/13/09  7:41 AM  Result Value Ref Range Status   Tube # 1  Final   Color, CSF COLORLESS COLORLESS Final   Appearance, CSF CLEAR CLEAR Final   Supernatant NOT INDICATED  Final   RBC Count, CSF 37 (H) 0 /cu mm Final   WBC, CSF 1 0 - 5 /cu mm Final   Other Cells, CSF   Final    TOO FEW TO COUNT, SMEAR AVAILABLE FOR REVIEW RARE LYMPHOCYTE AND RARE  MONOCYTE SEEN ON SMEAR  Protein and glucose, CSF     Status: None   Collection Time: 10/13/09  7:41 AM  Result Value Ref Range Status   Glucose, CSF 52 43 - 76 mg/dL Final   Total  Protein, CSF 29 15 - 45 mg/dL Final  CSF culture     Status: None   Collection Time: 10/13/09  7:41 AM  Result Value Ref Range Status   Specimen Description CSF  Final   Special Requests NONE  Final   Gram Stain   Final    WBC NO ORGANISMS SEEN Performed by [facility]   Culture NO GROWTH 3 DAYS  Final   Report Status 10/17/2009 FINAL  Final  Gram stain     Status: None   Collection Time: 10/13/09  7:41 AM  Result Value Ref Range Status   Specimen Description CSF  Final   Special Requests NONE  Final   Gram Stain   Final    NO ORGANISMS SEEN WBC SEEN CALLED TO T.MILNER RN AT 8502 ON 10/13/09 BY C.BONGEL   Report Status 10/13/2009 FINAL  Final  CSF cell count with differential     Status: Abnormal   Collection Time: 10/13/09  7:42 AM  Result Value Ref Range Status   Tube # 4  Final   Color, CSF COLORLESS COLORLESS Final   Appearance, CSF CLEAR CLEAR Final   Supernatant NOT INDICATED  Final   RBC Count, CSF 1 (H) 0 /cu mm Final   WBC, CSF 1 0 - 5 /cu mm Final   Other Cells, CSF   Final    TOO FEW TO COUNT, SMEAR AVAILABLE FOR REVIEW RARE LYMPHOCYTE AND RARE MONOCYTE SEEN ON SMEAR    Coagulation Studies:  Recent Labs  09/27/2014 1104  LABPROT 14.4  INR 1.11    Urinalysis: No results for input(s): COLORURINE, LABSPEC, PHURINE, GLUCOSEU, HGBUR, BILIRUBINUR, KETONESUR, PROTEINUR, UROBILINOGEN, NITRITE, LEUKOCYTESUR in the last 168 hours.  Invalid input(s): APPERANCEUR  Lipid Panel:  No results found for: CHOL, TRIG, HDL, CHOLHDL, VLDL, LDLCALC  HgbA1C: No results found for: HGBA1C  Urine Drug Screen:  No results found for: LABOPIA, COCAINSCRNUR, LABBENZ, AMPHETMU, THCU, LABBARB  Alcohol Level: No results for input(s): ETH in the last 168 hours.   Imaging: Ct Head (brain) Wo  Contrast  10/02/2014   CLINICAL DATA:  Left facial droop and flaccid missed.  EXAM: CT HEAD WITHOUT CONTRAST  TECHNIQUE: Contiguous axial images were obtained from the base of the skull through the vertex without intravenous contrast.  COMPARISON:  10/19/2009  FINDINGS: Brain: Hyperdense right M1, from the carotid terminus to the lower sylvian fissure. Low-density right caudate head and putamen and indistinct insular ribbon. No clear cortical infarct outside of the insula. This correlates with ASPECTS of 7. No intracranial hemorrhage.  Advanced brain atrophy with ventriculomegaly. Prominent mesial temporal lobe involvement suggest Alzheimer's disease in this patient with history of dementia. Chronic small-vessel disease with ischemic gliosis around the lateral ventricles and small remote lacunar infarct in the upper left cerebellum.  Skull and Sinuses:Negative for fracture or destructive process. The mastoids, middle ears, and imaged paranasal sinuses are clear.  Orbits: Gaze deviation to the right.  Critical Value/emergent results were called by telephone at the time of interpretation on 09/11/2014 at 11:19 am to Dr. Doy Mince, who verbally acknowledged these results.  IMPRESSION: 1. Right M1 thrombosis with probable early infarction in the right caudate, putamen, and insula. 2. Brain atrophy, as above.   Electronically Signed   By: Monte Fantasia M.D.   On: 09/14/2014 11:21    Assessment: 81 y.o. female presenting with acute onset left sided weakness and neglect with right eye deviation.  Right MCA infarct suspected.  Head CT personally reviewed and shows a dense right M1.  Contraindications reviewed.  Risks and benefits discussed with husband and verbal consent obtained.  tPA administered.   Due to mRankin score patient not considered an intervention candidate. Patient wishes discussed with husband.  Patient wishes to be a DNR and has a living will.    Stroke Risk Factors - none  Plan: 1. HgbA1c, fasting  lipid panel 2. MRI, MRA  of the brain without contrast 3. PT consult, OT consult, Speech consult 4. Echocardiogram 5. Carotid dopplers 6. Prophylactic therapy-None 7. NPO until RN stroke swallow screen 8. Telemetry monitoring 9. Frequent neuro checks 10. Admission to NICU 11. Repeat head CT in 24 hours   This patient is critically ill and at significant risk of neurological worsening, death and care requires constant monitoring of vital signs, hemodynamics,respiratory and cardiac monitoring, neurological assessment, discussion with family, other specialists and medical decision making of high complexity. I spent 90 minutes of neurocritical care time  in the care of  this patient.  Alexis Goodell, MD [facility] [phone] 09/14/2014  12:23 PM  Addendum: Patient with projectile vomiting after tPA bolus.  Patient sent back to CT for STAT CT of head without contrast to rule out hemorrhage and tPA temporarily held.  No hemorrhage noted and tPA restarted.    Alexis Goodell, MD [facility] [phone]

## 2014-09-04 NOTE — Progress Notes (Signed)
   10/02/2014 1600  Clinical Encounter Type  Visited With Patient;Health care provider  Visit Type Initial;Spiritual support;Social support;Critical Care  Referral From Nurse   Chaplain was referred to patient via spiritual care consult. Consult indicated patient would like prayer. Chaplain visited with patient very briefly. Patient was laying on her side and when asked about prayer did not seem to understand. Chaplain ensured her that if she needed someone to come by and pray or talk later that a chaplain would be available. Chaplain consulted with nurses afterwards and it appears that the patient is experiencing a lot of confusion at the moment. Chaplain will follow up with patient as needed.  Geral Tuch, Claudius Sis, Chaplain  4:13 PM

## 2014-09-04 NOTE — ED Notes (Addendum)
Pt. Had episode of vomiting. TPA stopped at 12:03 PER Dr. Doy Mince. No changes to pt. Neuro assessment. Pt. Taken to stat CT

## 2014-09-04 NOTE — ED Notes (Signed)
Per [facility]:Pt. Had witness onset of L side flaccidity and L facial droop at 1020 by husband. Pt is usually ambulatory with walker. Pt .has hx of dementia. Upon EMS arrival, pt. Did regain some ability to move L side. Pt has R sided gaze. Pt. Able to follow commands.

## 2014-09-04 NOTE — Progress Notes (Signed)
Pharmacy called at 11:24 for IV tPA, delivered to the hands of the RN at 11:32.  Speed, Pharm.D., BCPS Clinical Pharmacist Pager: [phone] 10/02/2014 11:37 AM

## 2014-09-05 ENCOUNTER — Encounter (HOSPITAL_COMMUNITY): Payer: Self-pay | Admitting: *Deleted

## 2014-09-05 ENCOUNTER — Inpatient Hospital Stay (HOSPITAL_COMMUNITY): Payer: Medicare Other

## 2014-09-05 DIAGNOSIS — G936 Cerebral edema: Secondary | ICD-10-CM

## 2014-09-05 DIAGNOSIS — F0390 Unspecified dementia without behavioral disturbance: Secondary | ICD-10-CM

## 2014-09-05 DIAGNOSIS — K922 Gastrointestinal hemorrhage, unspecified: Secondary | ICD-10-CM

## 2014-09-05 DIAGNOSIS — I10 Essential (primary) hypertension: Secondary | ICD-10-CM

## 2014-09-05 DIAGNOSIS — I6789 Other cerebrovascular disease: Secondary | ICD-10-CM

## 2014-09-05 LAB — BASIC METABOLIC PANEL
Anion gap: 8 (ref 5–15)
BUN: 21 mg/dL (ref 6–23)
CALCIUM: 8.6 mg/dL (ref 8.4–10.5)
CO2: 23 mmol/L (ref 19–32)
Chloride: 107 mmol/L (ref 96–112)
Creatinine, Ser: 1.31 mg/dL — ABNORMAL HIGH (ref 0.50–1.10)
GFR, EST AFRICAN AMERICAN: 40 mL/min — AB (ref >90)
GFR, EST NON AFRICAN AMERICAN: 35 mL/min — AB (ref >90)
Glucose, Bld: 132 mg/dL — ABNORMAL HIGH (ref 70–99)
Potassium: 3.9 mmol/L (ref 3.5–5.1)
Sodium: 138 mmol/L (ref 135–145)

## 2014-09-05 LAB — LIPID PANEL
Cholesterol: 137 mg/dL (ref 0–200)
HDL: 42 mg/dL (ref >39)
LDL CALC: 85 mg/dL (ref 0–99)
TRIGLYCERIDES: 49 mg/dL (ref <150)
Total CHOL/HDL Ratio: 3.3 RATIO
VLDL: 10 mg/dL (ref 0–40)

## 2014-09-05 LAB — CBC
HCT: 32.8 % — ABNORMAL LOW (ref 36.0–46.0)
Hemoglobin: 11.2 g/dL — ABNORMAL LOW (ref 12.0–15.0)
MCH: 30.1 pg (ref 26.0–34.0)
MCHC: 34.1 g/dL (ref 30.0–36.0)
MCV: 88.2 fL (ref 78.0–100.0)
PLATELETS: 210 10*3/uL (ref 150–400)
RBC: 3.72 MIL/uL — AB (ref 3.87–5.11)
RDW: 14.1 % (ref 11.5–15.5)
WBC: 16.3 10*3/uL — AB (ref 4.0–10.5)

## 2014-09-05 MED ORDER — ASPIRIN 300 MG RE SUPP
300.0000 mg | Freq: Every day | RECTAL | Status: DC
  Administered 2014-09-05 – 2014-09-07 (×2): 300 mg via RECTAL
  Filled 2014-09-05 (×4): qty 1

## 2014-09-05 MED ORDER — INSULIN ASPART 100 UNIT/ML ~~LOC~~ SOLN: 0.0000 [IU] | Freq: Three times a day (TID) | SUBCUTANEOUS | Status: DC

## 2014-09-05 NOTE — Evaluation (Signed)
Clinical/Bedside Swallow Evaluation Patient Details  Name: Megan Ho MRN: [id number] Date of Birth: [birth year]  Today's Date: 09/05/2014 Time: SLP Start Time (ACUTE ONLY): 0902 SLP Stop Time (ACUTE ONLY): 0912 SLP Time Calculation (min) (ACUTE ONLY): 10 min  Past Medical History:  Past Medical History  Diagnosis Date  . Dementia   . Arthritis   . Cancer     Skin   Past Surgical History:  Past Surgical History  Procedure Laterality Date  . Abdominal hysterectomy    . Knee arthroscopy w/ meniscal repair    . Cataract extraction    . Cholecystectomy     HPI:  81 year old female with baseline severe dementia admitted with left sided weakness. TPA administered. CT head showed Right M1 thrombosis with probable early infarction in the right   Assessment / Plan / Recommendation Clinical Impression  Bedside swallow evaluation complete. Patient with oral impairements impacted by CN VII and XII involvement as well as now exacerbated baseline cognitive impairements impacting safety and efficiency with swallow function. Periodic wet vocal quality at baseline suggestive of decreased pharyngeal management of secretions, congested upper airway noise, oral delays, and periodic coughing post swallow with ice chips trials all suggestive of decreased airway protection. Recommend NPO. will f/u for diagnostic po trials at bedside as mentation improves.     Aspiration Risk  Severe    Diet Recommendation NPO;Alternative means - temporary   Medication Administration: Via alternative means    Other  Recommendations Oral Care Recommendations: Oral care BID   Follow Up Recommendations  Skilled Nursing facility    Frequency and Duration min 3x week  2 weeks   Pertinent Vitals/Pain n/a        Swallow Study    General HPI: 81 year old female with baseline severe dementia admitted with left sided weakness. TPA administered. CT head showed Right M1 thrombosis with probable early infarction in the  right Type of Study: Bedside swallow evaluation Previous Swallow Assessment: none noted Diet Prior to this Study: NPO Temperature Spikes Noted: No Respiratory Status: Room air History of Recent Intubation: No Behavior/Cognition: Lethargic;Distractible;Requires cueing;Decreased sustained attention Oral Cavity - Dentition: Poor condition;Missing dentition Self-Feeding Abilities: Needs assist Patient Positioning: Upright in bed Baseline Vocal Quality: Wet Volitional Cough: Cognitively unable to elicit Volitional Swallow: Able to elicit    Oral/Motor/Sensory Function Overall Oral Motor/Sensory Function: Impaired Labial ROM: Reduced left Labial Symmetry: Abnormal symmetry left Labial Strength: Reduced Lingual ROM: Reduced left;Reduced right Lingual Symmetry: Abnormal symmetry left Lingual Strength: Reduced Facial ROM: Reduced left Facial Symmetry: Left droop Facial Strength: Reduced Velum:  (unable to assess) Mandible: Within Functional Limits   Ice Chips Ice chips: Impaired Presentation: Spoon Oral Phase Impairments: Impaired anterior to posterior transit;Poor awareness of bolus Oral Phase Functional Implications: Prolonged oral transit;Oral holding Pharyngeal Phase Impairments: Suspected delayed Swallow;Cough - Delayed   Thin Liquid Thin Liquid: Not tested    Nectar Thick Nectar Thick Liquid: Not tested   Honey Thick Honey Thick Liquid: Not tested   Puree Puree: Not tested   Solid   GO   Ivonne Freeburg MA, CCC-SLP [phone]  Solid: Not tested       Frimet Durfee Meryl 09/05/2014,9:35 AM

## 2014-09-05 NOTE — Evaluation (Signed)
Speech Language Pathology Evaluation Patient Details Name: Megan Ho MRN: [id number] DOB: April 03, 1922 Today's Date: 09/05/2014 Time: 0912-0922 SLP Time Calculation (min) (ACUTE ONLY): 10 min  Problem List:  Patient Active Problem List   Diagnosis Date Noted  . Cerebral infarction due to embolism of right middle cerebral artery 09/08/2014   Past Medical History:  Past Medical History  Diagnosis Date  . Dementia   . Arthritis   . Cancer     Skin   Past Surgical History:  Past Surgical History  Procedure Laterality Date  . Abdominal hysterectomy    . Knee arthroscopy w/ meniscal repair    . Cataract extraction    . Cholecystectomy     HPI:  81 year old female with baseline severe dementia admitted with left sided weakness. TPA administered. CT head showed Right M1 thrombosis with probable early infarction in the right   Assessment / Plan / Recommendation Clinical Impression  Cognitive-linguistic evaluation complete. Baseline severe dementia documented. Patient now with exacerbated baseline cognitive deficits impacting attention, awareness, problem solving, safety in addition to left sided neglect with right side visual gaze preference. Limited eye opening noted impacting function. Patient with intact expressive language and ability to follow basic commands for carryout of basic familiar ADL with moderate verbal cueing. SLP will f/u for facilitation of improved cognition for carryout of basic ADLs.     SLP Assessment  Patient needs continued Speech Lanaguage Pathology Services    Follow Up Recommendations  Skilled Nursing facility    Frequency and Duration min 3x week  2 weeks   Pertinent Vitals/Pain Pain Assessment: No/denies pain   SLP Goals  Potential to Achieve Goals (ACUTE ONLY): Good Potential Considerations (ACUTE ONLY): Co-morbidities;Ability to learn/carryover information  SLP Evaluation Prior Functioning  Cognitive/Linguistic Baseline: Baseline  deficits Baseline deficit details: severe dementia reported, unknown specifics   Cognition  Overall Cognitive Status: Impaired/Different from baseline Arousal/Alertness: Lethargic Orientation Level: Oriented to person;Disoriented to place;Disoriented to situation;Disoriented to time Attention: Sustained Sustained Attention: Impaired Sustained Attention Impairment: Verbal basic;Functional basic Memory: Impaired Memory Impairment: Storage deficit;Retrieval deficit Awareness: Impaired Awareness Impairment: Intellectual impairment Problem Solving: Impaired Problem Solving Impairment: Verbal basic;Functional basic Safety/Judgment: Impaired Comments: mitten in place    Comprehension  Auditory Comprehension Overall Auditory Comprehension: Impaired Commands: Impaired Two Step Basic Commands: 50-74% accurate Conversation: Simple Interfering Components: Attention;Processing speed Visual Recognition/Discrimination Discrimination:  (unable to assess due to visual/attention impairements) Reading Comprehension Reading Status: Unable to assess (comment) (visual/attention impairements)    Expression Expression Primary Mode of Expression: Verbal Verbal Expression Overall Verbal Expression: Appears within functional limits for tasks assessed   Oral / Motor Oral Motor/Sensory Function Overall Oral Motor/Sensory Function: Impaired Labial ROM: Reduced left Labial Symmetry: Abnormal symmetry left Labial Strength: Reduced Lingual ROM: Reduced left;Reduced right Lingual Symmetry: Abnormal symmetry left Lingual Strength: Reduced Facial ROM: Reduced left Facial Symmetry: Left droop Facial Strength: Reduced Velum:  (unable to assess) Mandible: Within Functional Limits Motor Speech Overall Motor Speech: Impaired Respiration: Within functional limits Phonation: Normal Resonance: Within functional limits Articulation: Impaired Level of Impairment: Conversation Intelligibility:  Intelligibility reduced Conversation: 75-100% accurate Effective Techniques: Over-articulate   GO    Katianna Mcclenney MA, CCC-SLP [phone]  Zani Kyllonen Meryl 09/05/2014, 9:43 AM

## 2014-09-05 NOTE — Progress Notes (Addendum)
STROKE TEAM PROGRESS NOTE   HISTORY Megan Ho is a 81 y.o. female who at baseline has a dementia and walks with a walker. Was able to get up this morning and eat breakfast. Her husband went to the bathroom and when he returned she was slumped over. EMS was called at that time and the patient was brought in as a code stroke with left sided weakness, facial droop At baseline sone reports that patient has severe dementia. Is able to walk with a walker. Dresses herself and is continent. Does not cook but can feed herself.   Date last known well: Date: 09/07/2014 Time last known well: Time: 10:00 tPA Given: Yes Pre-admission mRankin: 3  SUBJECTIVE (INTERVAL HISTORY) No family is at the bedside.  Overall she feels her condition is stable. She is lethargic and very dysarthric. On one of her previous discharge summary stated hx of afib, but can not see anywhere else. 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 Need to gather more information.    OBJECTIVE Temp:  [96.7 F (35.9 C)-98.7 F (37.1 C)] 98.4 F (36.9 C) (04/02 0342) Pulse Rate:  [55-85] 80 (04/02 0700) Cardiac Rhythm:  [-] Normal sinus rhythm (04/02 0400) Resp:  [15-38] 32 (04/02 0700) BP: (140-195)/(58-98) 140/70 mmHg (04/02 0700) SpO2:  [89 %-99 %] 93 % (04/02 0700) Weight:  [60.8 kg (134 lb 0.6 oz)] 60.8 kg (134 lb 0.6 oz) (04/01 1345)  No results for input(s): GLUCAP in the last 168 hours.  Recent Labs Lab 09/30/2014 1104  NA 138  K 3.6  CL 105  CO2 22  GLUCOSE 118*  BUN 18  CREATININE 1.22*  CALCIUM 8.6    Recent Labs Lab 09/26/2014 1104  AST 49*  ALT 28  ALKPHOS 192*  BILITOT 1.1  PROT 6.3  ALBUMIN 2.8*    Recent Labs Lab 09/08/2014 1104  WBC 9.3  NEUTROABS 4.3  HGB 12.9  HCT 38.6  MCV 89.1  PLT 213   No results for input(s): CKTOTAL, CKMB, CKMBINDEX, TROPONINI in the last 168 hours.  Recent Labs  09/22/2014 1104  LABPROT 14.4  INR 1.11   No results for input(s): COLORURINE, LABSPEC, PHURINE,  GLUCOSEU, HGBUR, BILIRUBINUR, KETONESUR, PROTEINUR, UROBILINOGEN, NITRITE, LEUKOCYTESUR in the last 72 hours.  Invalid input(s): APPERANCEUR     Component Value Date/Time   CHOL 137 09/05/2014 0225   TRIG 49 09/05/2014 0225   HDL 42 09/05/2014 0225   CHOLHDL 3.3 09/05/2014 0225   VLDL 10 09/05/2014 0225   LDLCALC 85 09/05/2014 0225   No results found for: HGBA1C No results found for: LABOPIA, COCAINSCRNUR, LABBENZ, AMPHETMU, THCU, LABBARB  No results for input(s): ETH in the last 168 hours.  Ct Head Wo Contrast 09/10/2014    Stable changes of right middle cerebral artery and internal carotid artery thrombosis.  No acute hemorrhage is noted.  Slight progression in the decreased attenuation seen in the right basal ganglia and insula.    Ct Head (brain) Wo Contrast 09/08/2014    1. Right M1 thrombosis with probable early infarction in the right caudate, putamen, and insula.  2. Brain atrophy, as above.    MRI and MRA - pending  2D echo - - Left ventricle: The cavity size was normal. Systolic function was normal. The estimated ejection fraction was in the range of 60% to 65%. Wall motion was normal; there were no regional wall motion abnormalities. Features are consistent with a pseudonormal left ventricular filling pattern, with concomitant  abnormal relaxation and increased filling pressure (grade 2 diastolic dysfunction). Doppler parameters are consistent with high ventricular filling pressure. Medial E/e&' 31.9. Mild focal basal septal hypertrophy. - Aortic valve: Mildly calcified annulus. Trileaflet; mildly thickened, mildly calcified leaflets. There was no stenosis. - Mitral valve: Moderately thickened annulus. Mildly thickened leaflets . There was mild regurgitation. - Left atrium: The atrium was moderately dilated. Volume/bsa, S: 35.5 ml/m^2. - Right atrium: The atrium was mildly dilated. - Tricuspid valve: There was moderate regurgitation. - Pulmonary  arteries: PA peak pressure: 31 mm Hg (S).  CUS - pending   PHYSICAL EXAM  Temp:  [97.7 F (36.5 C)-98.7 F (37.1 C)] 98.4 F (36.9 C) (04/02 0342) Pulse Rate:  [61-85] 66 (04/02 1200) Resp:  [15-38] 21 (04/02 1200) BP: (140-195)/(60-90) 140/60 mmHg (04/02 1200) SpO2:  [89 %-99 %] 93 % (04/02 1200) Weight:  [134 lb 0.6 oz (60.8 kg)] 134 lb 0.6 oz (60.8 kg) (04/01 1345)  General - thin built, well developed, lethargic, eyes closed and very dysarthria.  Ophthalmologic - fundi not visualized due to not able to keep eyes open.  Cardiovascular - Regular rate and rhythm.  Skin - multiple bruise bilateral arms  Neuro - pt somnolent and lethargic, not open eyes, but able to briefly open with constant stimulation. Very dysarthric and able to tell me her name and age but intelligible on other orientation questions. Not able to test naming or repetition. Eyes deviated to the right and not cross to midline. Not blink to visual threat on the left. PERRL, left facial droop. LUE 0/5 and LLE 3-/5 on pain stimulation. RUE and RLE spontaneous movement, on pain stimulation. RUE 3+/5 and RLE 3-/5. Reflex 1+ and babinski equivocal bilaterally. Sensation, coordination and gait not tested.  ASSESSMENT/PLAN Megan Ho is a 81 y.o. female with history of severe dementia, ? Afib, GIB, autoimmune hepatitis, CHF, CAD was admitted for left side weakness, right gaze and left facial droop. She received IV t-PA in ER and admitted to ICU.   Stroke:  Non-dominant right cortical stroke likely embolic secondary to unknown source, ? Afib  Resultant  Right gaze, left hemiparesis and left facial droop  MRI  pending  MRA  pending  Carotid Doppler  pending  2D Echo  unremarkable  LDL 85, not at goal  HgbA1c pending  SCDs for VTE prophylaxis  Diet NPO time specified  clopidogrel 75 mg orally every day prior to admission, now on no antithrombotic due to within tPA windown  Ongoing aggressive stroke  risk factor management  Therapy recommendations:  pending  Disposition:  Pending  ? 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  Hyperlipidemia  Home meds:  none   LDL 85, goal < 70  Add pravastatin once po access.  Continue statin at discharge  Diabetes  HgbA1c pending, goal < 7.0  Controlled  SSI  CBG monitoring  Dementia  On namenda at home  Other Stroke Risk Factors  Advanced age  Coronary artery disease  Other Active Problems  Leukocytosis - check CXR and UA  Elevated creatinine  Other Pertinent History    Hospital day # 1  This patient is critically ill due to right large cortical stroke s/p tPA, right MCA occlusion, hx of GIB and at significant risk of neurological worsening, death form hemorrhagic transformation, cerebral edema, GIB and seizure. This patient's care requires constant monitoring of vital signs, hemodynamics, respiratory and cardiac monitoring, review of multiple databases, neurological assessment, discussion with family, other specialists and medical decision making of high complexity. I spent 40 minutes of neurocritical care time in the care of this patient.  Rosalin Hawking, MD PhD Stroke Neurology 09/05/2014 1:26 PM    To contact Stroke Continuity provider, please refer to [email or web address] After hours, contact General Neurology

## 2014-09-05 NOTE — Progress Notes (Signed)
  Echocardiogram 2D Echocardiogram has been performed.  Darlina Sicilian M 09/05/2014, 10:05 AM

## 2014-09-05 NOTE — Progress Notes (Signed)
PT Cancellation Note  Patient Details Name: NATALE BARBA MRN: [id number] DOB: 17-Mar-1922   Cancelled Treatment:    Reason Eval/Treat Not Completed: Patient not medically ready per Dr. Erlinda Hong.  Will follow up tomorrow for eval if medically ready.     Ladona Ridgel 09/05/2014, 8:54 AM Gerlean Ren PT Acute Rehab Services [phone] Beeper [phone]

## 2014-09-06 ENCOUNTER — Inpatient Hospital Stay (HOSPITAL_COMMUNITY): Payer: Medicare Other

## 2014-09-06 DIAGNOSIS — E785 Hyperlipidemia, unspecified: Secondary | ICD-10-CM

## 2014-09-06 DIAGNOSIS — R1314 Dysphagia, pharyngoesophageal phase: Secondary | ICD-10-CM

## 2014-09-06 DIAGNOSIS — D72829 Elevated white blood cell count, unspecified: Secondary | ICD-10-CM

## 2014-09-06 LAB — BASIC METABOLIC PANEL
ANION GAP: 7 (ref 5–15)
BUN: 28 mg/dL — ABNORMAL HIGH (ref 6–23)
CO2: 24 mmol/L (ref 19–32)
CREATININE: 1.35 mg/dL — AB (ref 0.50–1.10)
Calcium: 8.2 mg/dL — ABNORMAL LOW (ref 8.4–10.5)
Chloride: 110 mmol/L (ref 96–112)
GFR calc non Af Amer: 33 mL/min — ABNORMAL LOW (ref >90)
GFR, EST AFRICAN AMERICAN: 39 mL/min — AB (ref >90)
Glucose, Bld: 132 mg/dL — ABNORMAL HIGH (ref 70–99)
POTASSIUM: 4 mmol/L (ref 3.5–5.1)
Sodium: 141 mmol/L (ref 135–145)

## 2014-09-06 LAB — URINALYSIS W MICROSCOPIC (NOT AT ARMC)
GLUCOSE, UA: NEGATIVE mg/dL
HGB URINE DIPSTICK: NEGATIVE
Ketones, ur: NEGATIVE mg/dL
Leukocytes, UA: NEGATIVE
Nitrite: NEGATIVE
PROTEIN: 30 mg/dL — AB
SPECIFIC GRAVITY, URINE: 1.018 (ref 1.005–1.030)
UROBILINOGEN UA: 1 mg/dL (ref 0.0–1.0)
pH: 5.5 (ref 5.0–8.0)

## 2014-09-06 LAB — GLUCOSE, CAPILLARY
GLUCOSE-CAPILLARY: 109 mg/dL — AB (ref 70–99)
GLUCOSE-CAPILLARY: 126 mg/dL — AB (ref 70–99)
Glucose-Capillary: 111 mg/dL — ABNORMAL HIGH (ref 70–99)
Glucose-Capillary: 109 mg/dL — ABNORMAL HIGH (ref 70–99)

## 2014-09-06 LAB — CBC
HCT: 33.1 % — ABNORMAL LOW (ref 36.0–46.0)
Hemoglobin: 10.9 g/dL — ABNORMAL LOW (ref 12.0–15.0)
MCH: 29.4 pg (ref 26.0–34.0)
MCHC: 32.9 g/dL (ref 30.0–36.0)
MCV: 89.2 fL (ref 78.0–100.0)
Platelets: 197 10*3/uL (ref 150–400)
RBC: 3.71 MIL/uL — AB (ref 3.87–5.11)
RDW: 14.6 % (ref 11.5–15.5)
WBC: 17.4 10*3/uL — ABNORMAL HIGH (ref 4.0–10.5)

## 2014-09-06 NOTE — Progress Notes (Signed)
Bilateral carotid artery duplex completed:  1-39% ICA stenosis.  Vertebral artery flow is antegrade.     

## 2014-09-06 NOTE — Evaluation (Signed)
Occupational Therapy Evaluation Patient Details Name: Megan Ho MRN: [id number] DOB: Oct 01, 1921 Today's Date: 09/06/2014    History of Present Illness 81 year old female with baseline severe dementia admitted with left sided weakness. TPA administered. CT head showed Right M1 thrombosis with probable early infarction in the right    Clinical Impression   This 81 yo female admitted with above presents to acute OT with decreased balance, decreased mobility, decreased use of left side, decreased eye/head turn to left, and decreased cognition (question worse than baseline) all affecting her ability to care for herself at level pta. She will benefit from acute OT with follow up at SNF.    Follow Up Recommendations  SNF    Equipment Recommendations   (TBD at next venue)       Precautions / Restrictions Precautions Precautions: Fall Restrictions Weight Bearing Restrictions: No      Mobility Bed Mobility Overal bed mobility: Needs Assistance;+2 for physical assistance Bed Mobility: Rolling;Supine to Sit;Sit to Supine Rolling: Total assist;+2 for physical assistance   Supine to sit: Total assist;+2 for physical assistance Sit to supine: Total assist;+2 for physical assistance   General bed mobility comments: Total assist to come from and return to bed, chuck pad and trunk support provided  Transfers Overall transfer level: Needs assistance Equipment used: 2 person hand held assist Transfers: Sit to/from Stand Sit to Stand: Total assist;+2 physical assistance;From elevated surface         General transfer comment: attempted, unable to clear hips off the surface     Balance Overall balance assessment: Needs assistance Sitting-balance support: Feet supported Sitting balance-Leahy Scale: Poor Sitting balance - Comments: min assist provided, patient at times able to hold balance and initiate some self correction (tolerated EOB trunk work >6 minutes Postural control:  Posterior lean;Left lateral lean                                  ADL Overall ADL's : Needs assistance/impaired                                       General ADL Comments: Curerntly total A for all due to decreased sitting balance and cognition     Vision Additional Comments: Pt with increased time can get her eyes past midline, but does not want to turn her head to midline--PROM has WNL for ROM of cervical for a 81 yo.          Pertinent Vitals/Pain Pain Assessment: No/denies pain     Hand Dominance Right   Extremity/Trunk Assessment Upper Extremity Assessment Upper Extremity Assessment: Generalized weakness;LUE deficits/detail;Difficult to assess due to impaired cognition LUE Deficits / Details: some active movement noted (minimal) but unable to move on command.  LUE Coordination: decreased fine motor;decreased gross motor   Lower Extremity Assessment Lower Extremity Assessment: Generalized weakness;Difficult to assess due to impaired cognition (active movement bilaterally)   Cervical / Trunk Assessment Cervical / Trunk Assessment: Kyphotic   Communication Communication Communication:  (dysarthic with increased time to respond at times)   Cognition Arousal/Alertness: Lethargic (increased arousal upon sitting up) Behavior During Therapy: Flat affect Overall Cognitive Status:  (history of cogntive impairments--queston baseline) Area of Impairment: Orientation;Attention;Memory;Following commands;Problem solving Orientation Level: Disoriented to;Place;Time;Situation Current Attention Level: Focused Memory: Decreased short-term memory Following Commands: Follows one step commands inconsistently;Follows one  step commands with increased time     Problem Solving: Slow processing;Decreased initiation;Difficulty sequencing;Requires verbal cues;Requires tactile cues General Comments: patient unable to carry out mobility tasks on commands but does  resposed to verbal questions. Perseveration throughout session (states OT had blue eyes, then upon questioning everything was answered with 'blue") Once correct choice and wrong choice provided patient could select proper answer 4/4 times.              Home Living Family/patient expects to be discharged to:: Skilled nursing facility Living Arrangements: Spouse/significant other;Children                                      Prior Functioning/Environment Level of Independence: Needs assistance  Gait / Transfers Assistance Needed: ambulated with RW     Comments: per history, able to walk with walker, dresses herself  and is typically continent at baseline    OT Diagnosis: Generalized weakness;Cognitive deficits;Disturbance of vision;Hemiplegia non-dominant side   OT Problem List: Decreased strength;Decreased range of motion;Decreased activity tolerance;Impaired balance (sitting and/or standing);Decreased safety awareness;Impaired sensation;Impaired tone;Decreased knowledge of use of DME or AE;Impaired vision/perception   OT Treatment/Interventions: Self-care/ADL training;Therapeutic exercise;Neuromuscular education;DME and/or AE instruction;Cognitive remediation/compensation;Therapeutic activities;Balance training;Patient/family education;Visual/perceptual remediation/compensation    OT Goals(Current goals can be found in the care plan section) Acute Rehab OT Goals Patient Stated Goal: none stated OT Goal Formulation: With patient Time For Goal Achievement: 09/20/14 Potential to Achieve Goals: Good  OT Frequency: Min 2X/week   Barriers to D/C: Decreased caregiver support          Co-evaluation PT/OT/SLP Co-Evaluation/Treatment: Yes Reason for Co-Treatment: Complexity of the patient's impairments (multi-system involvement);For patient/therapist safety   OT goals addressed during session: Strengthening/ROM      End of Session Equipment Utilized During  Treatment:  (none) Nurse Communication: Mobility status  Activity Tolerance: Patient limited by fatigue;Patient limited by lethargy Patient left: in bed;with call bell/phone within reach;with bed alarm set   Time: 7588-3254 OT Time Calculation (min): 24 min Charges:  OT General Charges $OT Visit: 1 Procedure OT Evaluation $Initial OT Evaluation Tier I: 1 Procedure OT Treatments $Self Care/Home Management : 8-22 mins  Almon Register [phone] 09/06/2014, 12:18 PM

## 2014-09-06 NOTE — Evaluation (Signed)
Physical Therapy Evaluation Patient Details Name: Megan Ho MRN: [id number] DOB: August 01, 1921 Today's Date: 09/06/2014   History of Present Illness  81 year old female with baseline severe dementia admitted with left sided weakness. TPA administered. CT head showed Right M1 thrombosis with probable early infarction in the right   Clinical Impression  Patient demonstrates deficits in functional mobility as indicated below. Will need continued skilled PT to address deficits and maximize function. Will see as indicated and progress as tolerated. At this time, unsure of true baseline cognition re:hx of dementia, anticipate patient will need increased rehabilitation time, recommend SNF.    Follow Up Recommendations SNF;Supervision/Assistance - 24 hour    Equipment Recommendations  Other (comment) (TBD)    Recommendations for Other Services       Precautions / Restrictions Precautions Precautions: Fall Restrictions Weight Bearing Restrictions: No      Mobility  Bed Mobility Overal bed mobility: Needs Assistance;+2 for physical assistance Bed Mobility: Rolling;Supine to Sit;Sit to Supine Rolling: Total assist;+2 for physical assistance   Supine to sit: Total assist;+2 for physical assistance Sit to supine: Total assist;+2 for physical assistance   General bed mobility comments: Total assist to come from and return to bed, chuck pad and trunk support provided  Transfers Overall transfer level: Needs assistance Equipment used: 2 person hand held assist Transfers: Sit to/from Stand Sit to Stand: Total assist;+2 physical assistance;From elevated surface         General transfer comment: attempted, unable to clear hips off the surface   Ambulation/Gait             General Gait Details: unable to perform  Stairs            Wheelchair Mobility    Modified Rankin (Stroke Patients Only) Modified Rankin (Stroke Patients Only) Pre-Morbid Rankin Score: Moderate  disability Modified Rankin: Severe disability     Balance Overall balance assessment: Needs assistance Sitting-balance support: Feet supported Sitting balance-Leahy Scale: Poor Sitting balance - Comments: min assist provided, patient at times able to hold balance and initiate some self correction (tolerated EOB trunk work >6 minutes Postural control: Posterior lean;Left lateral lean                                   Pertinent Vitals/Pain Pain Assessment: No/denies pain    Home Living Family/patient expects to be discharged to:: Skilled nursing facility Living Arrangements: Spouse/significant other;Children                    Prior Function Level of Independence: Needs assistance   Gait / Transfers Assistance Needed: ambulated with RW     Comments: per history, able to walk with walker, dresses herself  and is typically continent at baseline     Hand Dominance   Dominant Hand: Right    Extremity/Trunk Assessment   Upper Extremity Assessment: Generalized weakness;LUE deficits/detail;Difficult to assess due to impaired cognition       LUE Deficits / Details: some active movement noted (minimal) but unable to move on command.    Lower Extremity Assessment: Generalized weakness;Difficult to assess due to impaired cognition (active movement bilaterally)      Cervical / Trunk Assessment: Kyphotic  Communication   Communication:  (dysarthric with increased response time)  Cognition Arousal/Alertness: Lethargic (arouses upon sitting EOB) Behavior During Therapy: Flat affect Overall Cognitive Status: History of cognitive impairments - at baseline Area of Impairment:  Orientation;Attention;Memory;Following commands;Problem solving Orientation Level: Disoriented to;Place;Time;Situation Current Attention Level: Focused Memory: Decreased short-term memory Following Commands: Follows one step commands inconsistently;Follows one step commands with  increased time     Problem Solving: Slow processing;Decreased initiation;Difficulty sequencing;Requires verbal cues;Requires tactile cues General Comments: patient unable to carry out mobility tasks on commands but does resposed to verbal questions. Perseveration throughout session (states OT had blue eyes, then upon questioning everything was answered with 'blue") Once correct choice and wrong choice provided patient could select proper answer 4/4 times.    General Comments General comments (skin integrity, edema, etc.): hygiene and pericare performed  during session prior to coming EOB.    Exercises        Assessment/Plan    PT Assessment Patient needs continued PT services  PT Diagnosis Difficulty walking;Generalized weakness;Hemiplegia non-dominant side;Altered mental status   PT Problem List Decreased strength;Decreased activity tolerance;Decreased balance;Decreased mobility;Decreased coordination;Decreased cognition;Impaired sensation  PT Treatment Interventions DME instruction;Gait training;Functional mobility training;Therapeutic activities;Therapeutic exercise;Balance training;Neuromuscular re-education;Cognitive remediation;Patient/family education;Wheelchair mobility training   PT Goals (Current goals can be found in the Care Plan section) Acute Rehab PT Goals Patient Stated Goal: none stated PT Goal Formulation: Patient unable to participate in goal setting Time For Goal Achievement: 09/20/14 Potential to Achieve Goals: Fair    Frequency Min 3X/week   Barriers to discharge        Co-evaluation               End of Session Equipment Utilized During Treatment: Gait belt Activity Tolerance: Patient limited by lethargy Patient left: in bed;with call bell/phone within reach;with bed alarm set Nurse Communication: Mobility status         Time: 6160-7371 PT Time Calculation (min) (ACUTE ONLY): 24 min   Charges:   PT Evaluation $Initial PT Evaluation Tier  I: 1 Procedure     PT G CodesDuncan Dull 09-23-14, 11:47 AM Alben Deeds, PT DPT  [phone]

## 2014-09-06 NOTE — Progress Notes (Signed)
STROKE TEAM PROGRESS NOTE   HISTORY Megan Ho is a 81 y.o. female who at baseline has a dementia and walks with a walker. Was able to get up this morning and eat breakfast. Her husband went to the bathroom and when he returned she was slumped over. EMS was called at that time and the patient was brought in as a code stroke with left sided weakness, facial droop At baseline sone reports that patient has severe dementia. Is able to walk with a walker. Dresses herself and is continent. Does not cook but can feed herself.   Date last known well: Date: 09/22/2014 Time last known well: Time: 10:00 tPA Given: Yes Pre-admission mRankin: 3  SUBJECTIVE (INTERVAL HISTORY) Overall her condition is stable. She is still lethargic and very dysarthric. On one of her previous discharge summary stated hx of afib, but can not see anywhere else. 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 I asked family regarding this and they have not been aware of any afib diagnosis and they are aware of the GI AVM and was told to be treated. She at baseline without GI issues.   Had family meeting with daughter, son, husband, son in law at the bedside. Updated them regarding current diagnosis treatment and prognosis. Showed the CT and MRI images. They would like to discuss among them and make decision further regarding feeding tube, invasive procedures, or palliative care.  OBJECTIVE Temp:  [98.1 F (36.7 C)-101.2 F (38.4 C)] 98.1 F (36.7 C) (04/03 0600) Pulse Rate:  [62-98] 71 (04/03 1446) Cardiac Rhythm:  [-] Normal sinus rhythm (04/03 0800) Resp:  [13-30] 25 (04/03 1446) BP: (131-164)/(53-96) 156/88 mmHg (04/03 1446) SpO2:  [94 %-99 %] 97 % (04/03 1446)   Recent Labs Lab 09/06/14 0948 09/06/14 1159  GLUCAP 109* 111*    Recent Labs Lab 09/12/2014 1104 09/05/14 0840 09/06/14 0235  NA 138 138 141  K 3.6 3.9 4.0  CL 105 107 110  CO2 22 23 24   GLUCOSE 118* 132* 132*  BUN 18 21 28*  CREATININE 1.22* 1.31*  1.35*  CALCIUM 8.6 8.6 8.2*    Recent Labs Lab 09/08/2014 1104  AST 49*  ALT 28  ALKPHOS 192*  BILITOT 1.1  PROT 6.3  ALBUMIN 2.8*    Recent Labs Lab 09/26/2014 1104 09/05/14 0840 09/06/14 0235  WBC 9.3 16.3* 17.4*  NEUTROABS 4.3  --   --   HGB 12.9 11.2* 10.9*  HCT 38.6 32.8* 33.1*  MCV 89.1 88.2 89.2  PLT 213 210 197   No results for input(s): CKTOTAL, CKMB, CKMBINDEX, TROPONINI in the last 168 hours.  Recent Labs  09/20/2014 1104  LABPROT 14.4  INR 1.11    Recent Labs  09/06/14 0028  COLORURINE YELLOW  LABSPEC 1.018  PHURINE 5.5  GLUCOSEU NEGATIVE  HGBUR NEGATIVE  BILIRUBINUR SMALL*  KETONESUR NEGATIVE  PROTEINUR 30*  UROBILINOGEN 1.0  NITRITE NEGATIVE  LEUKOCYTESUR NEGATIVE       Component Value Date/Time   CHOL 137 09/05/2014 0225   TRIG 49 09/05/2014 0225   HDL 42 09/05/2014 0225   CHOLHDL 3.3 09/05/2014 0225   VLDL 10 09/05/2014 0225   LDLCALC 85 09/05/2014 0225   No results found for: HGBA1C No results found for: LABOPIA, COCAINSCRNUR, LABBENZ, AMPHETMU, THCU, LABBARB  No results for input(s): ETH in the last 168 hours.  I have personally reviewed the radiological images below and agree with the radiology interpretations.  Ct Head Wo Contrast 09/16/2014  Stable changes of right middle cerebral artery and internal carotid artery thrombosis.  No acute hemorrhage is noted.  Slight progression in the decreased attenuation seen in the right basal ganglia and insula.    Ct Head (brain) Wo Contrast 09/05/2014    1. Right M1 thrombosis with probable early infarction in the right caudate, putamen, and insula.  2. Brain atrophy, as above.    MRI and MRA - pending  2D echo - - Left ventricle: The cavity size was normal. Systolic function was normal. The estimated ejection fraction was in the range of 60% to 65%. Wall motion was normal; there were no regional wall motion abnormalities. Features are consistent with a pseudonormal left  ventricular filling pattern, with concomitant abnormal relaxation and increased filling pressure (grade 2 diastolic dysfunction). Doppler parameters are consistent with high ventricular filling pressure. Medial E/e&' 31.9. Mild focal basal septal hypertrophy. - Aortic valve: Mildly calcified annulus. Trileaflet; mildly thickened, mildly calcified leaflets. There was no stenosis. - Mitral valve: Moderately thickened annulus. Mildly thickened leaflets . There was mild regurgitation. - Left atrium: The atrium was moderately dilated. Volume/bsa, S: 35.5 ml/m^2. - Right atrium: The atrium was mildly dilated. - Tricuspid valve: There was moderate regurgitation. - Pulmonary arteries: PA peak pressure: 31 mm Hg (S).  CUS - Bilateral: 1-39% ICA stenosis. Vertebral artery flow is antegrade.  LE venous doppler - pending   PHYSICAL EXAM  Temp:  [98.1 F (36.7 C)-101.2 F (38.4 C)] 98.1 F (36.7 C) (04/03 0600) Pulse Rate:  [62-98] 71 (04/03 1446) Resp:  [13-30] 25 (04/03 1446) BP: (131-164)/(53-96) 156/88 mmHg (04/03 1446) SpO2:  [94 %-99 %] 97 % (04/03 1446)  General - thin built, well developed, lethargic, eyes closed and very dysarthria.  Ophthalmologic - fundi not visualized due to not able to keep eyes open.  Cardiovascular - Regular rate and rhythm.  Skin - multiple bruise bilateral arms  Neuro - pt somnolent and lethargic, not open eyes, but able to briefly open with constant stimulation. Very dysarthric and able to tell me her name and age but intelligible on other orientation questions. However, pt did work with PT/OT and answered several questions during therapy. Not able to test naming or repetition. Eyes deviated to the right and not able to cross midline. Not blink to visual threat on the left. PERRL, left facial droop. LUE 0/5 and LLE 3-/5 on pain stimulation. RUE and RLE spontaneous movement, on pain stimulation. RUE 3+/5 and RLE 3-/5. Reflex 1+ and babinski  equivocal bilaterally. Sensation, coordination and gait not tested.  ASSESSMENT/PLAN Ms. VIOLETA LECOUNT is a 81 y.o. female with history of severe dementia, ? 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 She received IV t-PA in ER and admitted to ICU.   Stroke:  Non-dominant right complete MCA stroke likely embolic secondary to unknown source, ? Afib but denied by family  Resultant  Right gaze, left hemiparesis and left facial droop  MRI  Large right complete MCA infarct  MRA  right M1 cutoff  Carotid Doppler  Unremarkable  2D Echo  Unremarkable  LE venous Doppler pending   LDL 85, not at goal  HgbA1c pending  SCDs for VTE prophylaxis Diet NPO time specified  clopidogrel 75 mg orally every day prior to admission, now on aspirin 300 PR   Ongoing aggressive stroke risk factor management  Therapy recommendations:  SNF  Disposition:  Pending, transfer to stepdown   Cerebral edema  Showing on MRI  No midline shift  Will repeat CT head in a.m.  Family yet to decide whether they would like 3% saline if patient cerebral edema getting worse  Dysphagia  Nothing by mouth  Speech follow-up  Family yet to decide whether they would like feeding tube  Will start tube feeding if family agree with feeding tube.  ? Afib  EKG sinus rhythm  Afib on previous discharge summary as hx but no more documentation  Family not aware of this diagnosis  Continue tele monitoring  Hx of GIB  2011 EGD showed gastric AVM - continue bleeding despite APC  Esophageal ulcer  On home plavix  H&H stable  family stated GI bleeding was treated and she has no baseline GI bleeding  Hypertension  Home meds:   toprol  Stable Permissive hypertension (OK if <220/120) for 24-48 hours post stroke and then gradually normalized within 5-7 days.  Hyperlipidemia  Home  meds:  none   LDL 85, goal < 70  Add pravastatin once po access.  Continue statin at discharge  Diabetes  HgbA1c pending, goal < 7.0  Controlled  SSI  CBG monitoring  Dementia  On namenda at home  Leukocytosis  WBC 9.3-> 16.3-> 17.4  Daily CXR  No clear pneumonia so far  Afebrile  UA negative  Close monitoring  Other Stroke Risk Factors  Advanced age  Coronary artery disease  Other Active Problems  Leukocytosis - check CXR and UA  Elevated creatinine  Other Pertinent History    Hospital day # 2  This patient is critically ill due to right large complete MCA stroke s/p tPA, right MCA occlusion, hx of GIB and at significant risk of neurological worsening, death form hemorrhagic transformation, cerebral edema, brain herniation, GIB and seizure. This patient's care requires constant monitoring of vital signs, hemodynamics, respiratory and cardiac monitoring, review of multiple databases, neurological assessment, discussion with family, other specialists and medical decision making of high complexity. I spent 35 minutes of neurocritical care time in the care of this patient.  Rosalin Hawking, MD PhD Stroke Neurology 09/06/2014 2:54 PM    To contact Stroke Continuity provider, please refer to [email or web address] After hours, contact General Neurology

## 2014-09-07 ENCOUNTER — Inpatient Hospital Stay (HOSPITAL_COMMUNITY): Payer: Medicare Other

## 2014-09-07 DIAGNOSIS — I639 Cerebral infarction, unspecified: Secondary | ICD-10-CM | POA: Insufficient documentation

## 2014-09-07 DIAGNOSIS — I63319 Cerebral infarction due to thrombosis of unspecified middle cerebral artery: Secondary | ICD-10-CM | POA: Insufficient documentation

## 2014-09-07 LAB — BASIC METABOLIC PANEL
ANION GAP: 8 (ref 5–15)
BUN: 33 mg/dL — AB (ref 6–23)
CALCIUM: 8.1 mg/dL — AB (ref 8.4–10.5)
CO2: 24 mmol/L (ref 19–32)
CREATININE: 1.3 mg/dL — AB (ref 0.50–1.10)
Chloride: 112 mmol/L (ref 96–112)
GFR calc Af Amer: 41 mL/min — ABNORMAL LOW (ref >90)
GFR calc non Af Amer: 35 mL/min — ABNORMAL LOW (ref >90)
GLUCOSE: 110 mg/dL — AB (ref 70–99)
Potassium: 3.9 mmol/L (ref 3.5–5.1)
Sodium: 144 mmol/L (ref 135–145)

## 2014-09-07 LAB — HEMOGLOBIN A1C
Hgb A1c MFr Bld: 5.2 % (ref 4.8–5.6)
Mean Plasma Glucose: 103 mg/dL

## 2014-09-07 LAB — CBC
HCT: 34.7 % — ABNORMAL LOW (ref 36.0–46.0)
Hemoglobin: 11.6 g/dL — ABNORMAL LOW (ref 12.0–15.0)
MCH: 30.1 pg (ref 26.0–34.0)
MCHC: 33.4 g/dL (ref 30.0–36.0)
MCV: 89.9 fL (ref 78.0–100.0)
Platelets: 152 K/uL (ref 150–400)
RBC: 3.86 MIL/uL — ABNORMAL LOW (ref 3.87–5.11)
RDW: 14.5 % (ref 11.5–15.5)
WBC: 22.7 K/uL — ABNORMAL HIGH (ref 4.0–10.5)

## 2014-09-07 LAB — GLUCOSE, CAPILLARY
GLUCOSE-CAPILLARY: 106 mg/dL — AB (ref 70–99)
Glucose-Capillary: 116 mg/dL — ABNORMAL HIGH (ref 70–99)
Glucose-Capillary: 100 mg/dL — ABNORMAL HIGH (ref 70–99)
Glucose-Capillary: 107 mg/dL — ABNORMAL HIGH (ref 70–99)

## 2014-09-07 MED ORDER — MORPHINE BOLUS VIA INFUSION
5.0000 mg | INTRAVENOUS | Status: DC | PRN
  Filled 2014-09-07: qty 20

## 2014-09-07 MED ORDER — HEPARIN SODIUM (PORCINE) 5000 UNIT/ML IJ SOLN
5000.0000 [IU] | Freq: Two times a day (BID) | INTRAMUSCULAR | Status: DC
  Administered 2014-09-07: 5000 [IU] via SUBCUTANEOUS
  Filled 2014-09-07: qty 1

## 2014-09-07 MED ORDER — MORPHINE SULFATE 25 MG/ML IV SOLN
10.0000 mg/h | INTRAVENOUS | Status: DC
  Administered 2014-09-07 – 2014-09-09 (×2): 10 mg/h via INTRAVENOUS
  Filled 2014-09-07 (×2): qty 10

## 2014-09-07 MED ORDER — LABETALOL HCL 5 MG/ML IV SOLN: 10.0000 mg | INTRAVENOUS | Status: DC | PRN

## 2014-09-07 NOTE — Progress Notes (Signed)
UR completed.  Daray Polgar, RN BSN [facility] CCM Trauma/Neuro ICU Case Manager [phone]  

## 2014-09-07 NOTE — Progress Notes (Signed)
Speech Language Pathology Treatment: Dysphagia;Cognitive-Linquistic  Patient Details Name: Megan Ho MRN: [id number] DOB: Mar 01, 1922 Today's Date: 09/07/2014 Time: 3818-2993 SLP Time Calculation (min) (ACUTE ONLY): 23 min  Assessment / Plan / Recommendation Clinical Impression  Pt seen for ability to initiate po's. She was easily aroused, oral hygiene provided. Suspected delayed swallows of thin, puree and honey consistencies, sluggish laryngeal elevation, multiple swallows, immediate cough following water and wet vocal quality after honey consistency. Pt likely aspirating. RN stated family discussion/MD discussion re: PEG. Objective assessment with MBS can be performed if desired, however if comfort care would recommend Dys 1 and honey consistency. Pt required moderate cues to state place and recalled after 20 minutes. She needed total cues for reason/diagnosis. Significant left neglect and mild-moderate cues given to sustain attention during eating/drinking. Continue ST.   HPI HPI: 81 year old female with baseline severe dementia admitted with left sided weakness. TPA administered. CT head showed Right M1 thrombosis with probable early infarction in the right   Pertinent Vitals Pain Assessment: No/denies pain  SLP Plan  Continue with current plan of care    Recommendations Diet recommendations: NPO (NPO vs comfort feeds) Medication Administration: Via alternative means              Oral Care Recommendations: Oral care BID Follow up Recommendations: Skilled Nursing facility Plan: Continue with current plan of care    GO     Houston Siren 09/07/2014, 9:02 AM  Orbie Pyo Colvin Caroli.Ed [employer] [phone]

## 2014-09-07 NOTE — Clinical Social Work Placement (Signed)
Clinical Social Work Department CLINICAL SOCIAL WORK PLACEMENT NOTE 09/07/2014  Patient:  Megan Ho, Megan Ho  Account Number:  [id number] Admit date:  09/23/2014  Clinical Social Worker:  Barbette Or, LCSW  Date/time:  09/07/2014 10:00 AM  Clinical Social Work is seeking post-discharge placement for this patient at the following level of care:   [facility]   (*CSW will update this form in Epic as items are completed)   09/07/2014  Patient/family provided with [facility] Department of Clinical Social Work's list of facilities offering this level of care within the geographic area requested by the patient (or if unable, by the patient's family).  09/07/2014  Patient/family informed of their freedom to choose among providers that offer the needed level of care, that participate in Medicare, Medicaid or managed care program needed by the patient, have an available bed and are willing to accept the patient.  09/07/2014  Patient/family informed of [facility]' ownership interest in [facility], as well as of the fact that they are under no obligation to receive care at this facility.  PASARR submitted to EDS on 09/07/2014 PASARR number received on 09/07/2014  FL2 transmitted to all facilities in geographic area requested by pt/family on  09/07/2014 FL2 transmitted to all facilities within larger geographic area on   Patient informed that his/her managed care company has contracts with or will negotiate with  certain facilities, including the following:     Patient/family informed of bed offers received:   Patient chooses bed at  Physician recommends and patient chooses bed at    Patient to be transferred to  on   Patient to be transferred to facility by  Patient and family notified of transfer on  Name of family member notified:    The following physician request were entered in Epic:   Additional Comments:

## 2014-09-07 NOTE — Progress Notes (Signed)
STROKE TEAM PROGRESS NOTE   HISTORY Megan Ho is a 81 y.o. female who at baseline has a dementia and walks with a walker. Was able to get up this morning and eat breakfast. Her husband went to the bathroom and when he returned she was slumped over. EMS was called at that time and the patient was brought in as a code stroke with left sided weakness, facial droop At baseline sone reports that patient has severe dementia. Is able to walk with a walker. Dresses herself and is continent. Does not cook but can feed herself.   Date last known well: Date: 09/24/2014 Time last known well: Time: 10:00 tPA Given: Yes Pre-admission mRankin: 3  SUBJECTIVE (INTERVAL HISTORY) Overall her condition is stable. She is more alert and very dysarthric. Had telephonic d/w  daughter,  Over the phone. Updated them regarding  prognosis.   MRI findings. They  Are leaning towards no feeding tube and comfort care but will make final decision soon OBJECTIVE Temp:  [97.5 F (36.4 C)-100.6 F (38.1 C)] 97.5 F (36.4 C) (04/04 0800) Pulse Rate:  [58-79] 64 (04/04 0800) Cardiac Rhythm:  [-] Normal sinus rhythm (04/04 0800) Resp:  [19-29] 28 (04/04 0800) BP: (129-225)/(68-89) 129/72 mmHg (04/04 0800) SpO2:  [95 %-99 %] 97 % (04/04 0800)   Recent Labs Lab 09/06/14 1602 09/06/14 1947 09/06/14 2317 09/07/14 0335 09/07/14 0820  GLUCAP 109* 126* 116* 100* 106*    Recent Labs Lab 09/27/2014 1104 09/05/14 0840 09/06/14 0235 09/07/14 0326  NA 138 138 141 144  K 3.6 3.9 4.0 3.9  CL 105 107 110 112  CO2 22 23 24 24   GLUCOSE 118* 132* 132* 110*  BUN 18 21 28* 33*  CREATININE 1.22* 1.31* 1.35* 1.30*  CALCIUM 8.6 8.6 8.2* 8.1*    Recent Labs Lab 09/15/2014 1104  AST 49*  ALT 28  ALKPHOS 192*  BILITOT 1.1  PROT 6.3  ALBUMIN 2.8*    Recent Labs Lab 09/08/2014 1104 09/05/14 0840 09/06/14 0235 09/07/14 0213  WBC 9.3 16.3* 17.4* 22.7*  NEUTROABS 4.3  --   --   --   HGB 12.9 11.2* 10.9* 11.6*   HCT 38.6 32.8* 33.1* 34.7*  MCV 89.1 88.2 89.2 89.9  PLT 213 210 197 152   No results for input(s): CKTOTAL, CKMB, CKMBINDEX, TROPONINI in the last 168 hours.  Recent Labs  09/13/2014 1104  LABPROT 14.4  INR 1.11    Recent Labs  09/06/14 0028  COLORURINE YELLOW  LABSPEC 1.018  PHURINE 5.5  GLUCOSEU NEGATIVE  HGBUR NEGATIVE  BILIRUBINUR SMALL*  KETONESUR NEGATIVE  PROTEINUR 30*  UROBILINOGEN 1.0  NITRITE NEGATIVE  LEUKOCYTESUR NEGATIVE       Component Value Date/Time   CHOL 137 09/05/2014 0225   TRIG 49 09/05/2014 0225   HDL 42 09/05/2014 0225   CHOLHDL 3.3 09/05/2014 0225   VLDL 10 09/05/2014 0225   LDLCALC 85 09/05/2014 0225   No results found for: HGBA1C No results found for: LABOPIA, COCAINSCRNUR, LABBENZ, AMPHETMU, THCU, LABBARB  No results for input(s): ETH in the last 168 hours.  I have personally reviewed the radiological images below and agree with the radiology interpretations.  Ct Head Wo Contrast 09/05/2014    Stable changes of right middle cerebral artery and internal carotid artery thrombosis.  No acute hemorrhage is noted.  Slight progression in the decreased attenuation seen in the right basal ganglia and insula.    Ct Head (brain) Wo Contrast 09/30/2014  1. Right M1 thrombosis with probable early infarction in the right caudate, putamen, and insula.  2. Brain atrophy, as above.    MRI and MRA - pending  2D echo - - Left ventricle: The cavity size was normal. Systolic function was normal. The estimated ejection fraction was in the range of 60% to 65%. Wall motion was normal; there were no regional wall motion abnormalities. Features are consistent with a pseudonormal left ventricular filling pattern, with concomitant abnormal relaxation and increased filling pressure (grade 2 diastolic dysfunction). Doppler parameters are consistent with high ventricular filling pressure. Medial E/e&' 31.9. Mild focal basal septal  hypertrophy. - Aortic valve: Mildly calcified annulus. Trileaflet; mildly thickened, mildly calcified leaflets. There was no stenosis. - Mitral valve: Moderately thickened annulus. Mildly thickened leaflets . There was mild regurgitation. - Left atrium: The atrium was moderately dilated. Volume/bsa, S: 35.5 ml/m^2. - Right atrium: The atrium was mildly dilated. - Tricuspid valve: There was moderate regurgitation. - Pulmonary arteries: PA peak pressure: 31 mm Hg (S).  CUS - Bilateral: 1-39% ICA stenosis. Vertebral artery flow is antegrade.  LE venous doppler - pending  MRI Brain 09/05/14 ; Acute infarction affecting the complete right middle cerebral artery territory. Mild swelling at this time but no hemorrhage or mass effect. Complete occlusion of the right middle cerebral artery by a large embolus. 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   PHYSICAL EXAM  Temp:  [97.5 F (36.4 C)-100.6 F (38.1 C)] 97.5 F (36.4 C) (04/04 0800) Pulse Rate:  [58-79] 64 (04/04 0800) Resp:  [19-29] 28 (04/04 0800) BP: (129-225)/(68-89) 129/72 mmHg (04/04 0800) SpO2:  [95 %-99 %] 97 % (04/04 0800)  General - thin built, well developed, lethargic, eyes closed and very dysarthria.  Ophthalmologic - fundi not visualized due to not able to keep eyes open.  Cardiovascular - Regular rate and rhythm.  Skin - multiple bruise bilateral arms  Neuro - pt alert but mild eye opening aopraxia, but able to briefly open with constant stimulation. Very dysarthric and able to tell me her name and age but intelligible on other orientation questions.  Not able to test naming or repetition. Eyes deviated to the right and not able to cross midline. Not blink to visual threat on the left. PERRL, left facial droop. LUE 0/5 and LLE 3-/5 on pain stimulation. RUE and RLE spontaneous movement, on pain stimulation. RUE 3+/5 and RLE 3-/5. Reflex 1+ and babinski  equivocal bilaterally. Sensation, coordination and gait not tested.  ASSESSMENT/PLAN Megan Ho is a 81 y.o. female with history of severe dementia, ? 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 She received IV t-PA in ER and admitted to ICU.   Stroke:  Non-dominant right complete MCA stroke likely embolic secondary to  Afib but  History denied by family  Resultant  Right gaze, left hemiplegia and left facial droop  MRI  Large right complete MCA infarct  MRA  right M1 cutoff  Carotid Doppler  Unremarkable  2D Echo  Unremarkable  LE venous Doppler pending   LDL 85, not at goal  HgbA1c pending  SCDs for VTE prophylaxis Diet NPO time specified  clopidogrel 75 mg orally every day prior to admission, now on aspirin 300 PR   Ongoing aggressive stroke risk factor management  Therapy recommendations:  SNF  Disposition:  Pending,  transfer to stepdown   Cerebral edema  Showing on MRI  No midline shift  Will repeat CT head in a.m.  Family yet to decide whether they would like 3% saline if patient cerebral edema getting worse  Dysphagia  Nothing by mouth  Speech follow-up  Family yet to decide whether they would like feeding tube  Will start tube feeding if family agree with feeding tube.  ? Afib  EKG sinus rhythm but afib on monitor now  Afib on previous discharge summary as hx but no more documentation  Family not aware of this diagnosis  Continue tele monitoring  Hx of GIB  2011 EGD showed gastric AVM - continue bleeding despite APC  Esophageal ulcer  On home plavix  H&H stable  family stated GI bleeding was treated and she has no baseline GI bleeding  Hypertension  Home meds:   toprol  Stable Permissive hypertension (OK if <220/120) for 24-48 hours post stroke and then gradually normalized within 5-7  days.  Hyperlipidemia  Home meds:  none   LDL 85, goal < 70  Add pravastatin once po access.  Continue statin at discharge  Diabetes  HgbA1c pending, goal < 7.0  Controlled  SSI  CBG monitoring  Dementia  On namenda at home  Leukocytosis  WBC 9.3-> 16.3-> 17.4  Daily CXR  No clear pneumonia so far  Afebrile  UA negative  Close monitoring  Other Stroke Risk Factors  Advanced age  Coronary artery disease  Other Active Problems  Leukocytosis - check CXR and UA  Elevated creatinine  Other Pertinent History    Hospital day # 3  This patient is critically ill due to right large complete MCA stroke s/p tPA, right MCA occlusion, hx of GIB and at significant risk of neurological worsening, death form hemorrhagic transformation, cerebral edema, brain herniation, GIB and seizure. This patient's care requires constant monitoring of vital signs, hemodynamics, respiratory and cardiac monitoring, review of multiple databases, neurological assessment, discussion with family, other specialists and medical decision making of high complexity. I spent 35 minutes of neurocritical care time in the care of this patient. I spoke to patient`s daughter and answered questions.  Antony Contras, MD Stroke Neurology 09/07/2014 10:12 AM    To contact Stroke Continuity provider, please refer to [email or web address] After hours, contact General Neurology

## 2014-09-07 NOTE — Clinical Social Work Note (Signed)
Clinical Social Work Department BRIEF PSYCHOSOCIAL ASSESSMENT 09/07/2014  Patient:  Megan Ho, Megan Ho     Account Number:  [id number]     Admit date:  09/17/2014  Clinical Social Worker:  Myles Lipps  Date/Time:  09/07/2014 10:00 AM  Referred by:  Physician  Date Referred:  09/07/2014 Referred for  SNF Placement   Other Referral:   Interview type:  Family Other interview type:   Spoke to patient husband and daughter over the phone    PSYCHOSOCIAL DATA Living Status:  HUSBAND Admitted from facility:   Level of care:   Primary support name:  Megan Ho, Megan Ho  [phone] / [place] Party [phone] Primary support relationship to patient:  SPOUSE Degree of support available:   Strong and appropriate    CURRENT CONCERNS Current Concerns  Post-Acute Placement   Other Concerns:    SOCIAL WORK ASSESSMENT / PLAN Clinical Social Worker spoke with patient husband and daughter over the phone to offer support and discuss patient needs at discharge.  Patient husband states that patient was living at home with him and their son prior to hospitalization.  Patient husband requested CSW contact patient daughter, Megan Ho, to confirm plans for patient at discharge.  CSW spoke with patient daughter over the phone who is understanding of SNF search process and agreeable to initiate search in [place].  Patient daughter also verbalizes the possibility of comfort care pending MRI results and the questions regarding feeding tube.  CSW to initiate SNF search and follow up with patient family to discuss bed offers if needed.  CSW remains available for support and to facilitate patient discharge needs once medically stable.   Assessment/plan status:  Psychosocial Support/Ongoing Assessment of Needs Other assessment/ plan:   Information/referral to community resources:   Clinical Social Worker to speak with patient family regarding SNF bed offers and if transitioning to comfort care will discuss hospice  options.    PATIENT'S/FAMILY'S RESPONSE TO PLAN OF CARE: Patient alert to person only at baseline.  Patient with wonderful family support and understanding of patient needs.  Patient husband is primary Media planner, however he requests that patient daughter be updated with plans. Patient family realistic regarding patient long term needs and have had full discussions regarding possible comfort care.  Patient daughter verbalized her understanding of CSW role and appreciation for support.

## 2014-09-07 NOTE — Progress Notes (Signed)
Chaplain responded to end of life consult concerning Megan Ho.   No family present and pt was sleeping. Chaplain did not awake pt.   Call if support needed.   Delford Field, Chaplain 09/07/2014

## 2014-09-07 NOTE — Progress Notes (Signed)
Pt received with no noted distress. Pt sleeping, no s/s of discomfort. IV infusing to right hand. Safety measures in place. Call bell within reach. Report called in by nurse Eddie Dibbles. Will continue to monitor.

## 2014-09-08 DIAGNOSIS — Z515 Encounter for palliative care: Secondary | ICD-10-CM | POA: Insufficient documentation

## 2014-09-08 NOTE — Clinical Social Work Note (Signed)
Patient has transferred to 4N, handoff has been provided for unit CSW. CSW signing off at this time.   Liz Beach MSW, [place], [place], [phone]

## 2014-09-08 NOTE — Progress Notes (Signed)
STROKE TEAM PROGRESS NOTE   HISTORY Megan Ho is a 81 y.o. female who at baseline has a dementia and walks with a walker. Was able to get up this morning and eat breakfast. Her husband went to the bathroom and when he returned she was slumped over. EMS was called at that time and the patient was brought in as a code stroke with left sided weakness, facial droop At baseline sone reports that patient has severe dementia. Is able to walk with a walker. Dresses herself and is continent. Does not cook but can feed herself.   Date last known well: Date: 09/17/2014 Time last known well: Time: 10:00 tPA Given: Yes Pre-admission mRankin: 3  SUBJECTIVE (INTERVAL HISTORY) Overall her condition is stable. She is more alert and very dysarthric.  I met with the patient's husband and son yesterday afternoon and explained the poor prognosis and they've decided to make her comfort care and she was transferred to the neurology floor and is currently resting peacefully on morphine drip.  OBJECTIVE Temp:  [98.4 F (36.9 C)-99.3 F (37.4 C)] 99.1 F (37.3 C) (04/05 1045) Pulse Rate:  [71-90] 90 (04/05 1045) Cardiac Rhythm:  [-]  Resp:  [14-33] 14 (04/05 1045) BP: (120-159)/(55-98) 120/55 mmHg (04/05 1045) SpO2:  [88 %-97 %] 88 % (04/05 1045)   Recent Labs Lab 09/06/14 1947 09/06/14 2317 09/07/14 0335 09/07/14 0820 09/07/14 1154  GLUCAP 126* 116* 100* 106* 107*    Recent Labs Lab 09/17/2014 1104 09/05/14 0840 09/06/14 0235 09/07/14 0326  NA 138 138 141 144  K 3.6 3.9 4.0 3.9  CL 105 107 110 112  CO2 _0 GLUCOSE 118* 132* 132* 110*  BUN 18 21 28* 33*  CREATININE 1.22* 1.31* 1.35* 1.30*  CALCIUM 8.6 8.6 8.2* 8.1*    Recent Labs Lab 10/03/2014 1104  AST 49*  ALT 28  ALKPHOS 192*  BILITOT 1.1  PROT 6.3  ALBUMIN 2.8*    Recent Labs Lab 10/01/2014 1104 09/05/14 0840 09/06/14 0235 09/07/14 0213  WBC 9.3 16.3* 17.4* 22.7*  NEUTROABS 4.3  --   --   --   HGB 12.9  11.2* 10.9* 11.6*  HCT 38.6 32.8* 33.1* 34.7*  MCV 89.1 88.2 89.2 89.9  PLT 213 210 197 152   No results for input(s): CKTOTAL, CKMB, CKMBINDEX, TROPONINI in the last 168 hours. No results for input(s): LABPROT, INR in the last 72 hours.  Recent Labs  09/06/14 0028  COLORURINE YELLOW  LABSPEC 1.018  PHURINE 5.5  GLUCOSEU NEGATIVE  HGBUR NEGATIVE  BILIRUBINUR SMALL*  KETONESUR NEGATIVE  PROTEINUR 30*  UROBILINOGEN 1.0  NITRITE NEGATIVE  LEUKOCYTESUR NEGATIVE       Component Value Date/Time   CHOL 137 09/05/2014 0225   TRIG 49 09/05/2014 0225   HDL 42 09/05/2014 0225   CHOLHDL 3.3 09/05/2014 0225   VLDL 10 09/05/2014 0225   LDLCALC 85 09/05/2014 0225   Lab Results  Component Value Date   HGBA1C 5.2 09/05/2014   No results found for: LABOPIA, COCAINSCRNUR, LABBENZ, AMPHETMU, THCU, LABBARB  No results for input(s): ETH in the last 168 hours.  I have personally reviewed the radiological images below and agree with the radiology interpretations.  Ct Head Wo Contrast 09/17/2014    Stable changes of right middle cerebral artery and internal carotid artery thrombosis.  No acute hemorrhage is noted.  Slight progression in the decreased attenuation seen in the right basal ganglia and insula.    Ct  Head (brain) Wo Contrast 09/14/2014    1. Right M1 thrombosis with probable early infarction in the right caudate, putamen, and insula.  2. Brain atrophy, as above.    MRI and MRA - pending  2D echo - - Left ventricle: The cavity size was normal. Systolic function was normal. The estimated ejection fraction was in the range of 60% to 65%. Wall motion was normal; there were no regional wall motion abnormalities. Features are consistent with a pseudonormal left ventricular filling pattern, with concomitant abnormal relaxation and increased filling pressure (grade 2 diastolic dysfunction). Doppler parameters are consistent with high ventricular filling pressure. Medial  E/e&' 31.9. Mild focal basal septal hypertrophy. - Aortic valve: Mildly calcified annulus. Trileaflet; mildly thickened, mildly calcified leaflets. There was no stenosis. - Mitral valve: Moderately thickened annulus. Mildly thickened leaflets . There was mild regurgitation. - Left atrium: The atrium was moderately dilated. Volume/bsa, S: 35.5 ml/m^2. - Right atrium: The atrium was mildly dilated. - Tricuspid valve: There was moderate regurgitation. - Pulmonary arteries: PA peak pressure: 31 mm Hg (S).  CUS - Bilateral: 1-39% ICA stenosis. Vertebral artery flow is antegrade.  LE venous doppler - pending  MRI Brain 09/05/14 ; Acute infarction affecting the complete right middle cerebral artery territory. Mild swelling at this time but no hemorrhage or mass effect. Complete occlusion of the right middle cerebral artery by a large embolus. 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   PHYSICAL EXAM  Temp:  [98.4 F (36.9 C)-99.3 F (37.4 C)] 99.1 F (37.3 C) (04/05 1045) Pulse Rate:  [71-90] 90 (04/05 1045) Resp:  [14-33] 14 (04/05 1045) BP: (120-159)/(55-98) 120/55 mmHg (04/05 1045) SpO2:  [88 %-97 %] 88 % (04/05 1045)  General - thin built, well developed, lethargic, eyes closed and very dysarthria.  Ophthalmologic - fundi not visualized due to not able to keep eyes open.  Cardiovascular - Regular rate and rhythm.  Skin - multiple bruise bilateral arms  Neuro - pt  stuporose sedated on morphine drip   but able to briefly open eyes with constant stimulation.   Eyes deviated to the right and not able to cross midline. Not blink to visual threat  . PERRL, left facial droop. LUE 0/5 and LLE 3-/5 on pain stimulation. RUE and RLE spontaneous movement, on pain stimulation. RUE 3+/5 and RLE 3-/5. Reflex 1+ and babinski equivocal bilaterally. Sensation, coordination and gait not tested.  ASSESSMENT/PLAN Ms. Megan Ho is a 81  y.o. female with history of severe dementia, ? 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 She received IV t-PA in ER and admitted to ICU. Patient DO NOT RESUSCITATE and comfort care measures only  Stroke:  Non-dominant right complete MCA stroke likely embolic secondary to  Afib but  History denied by family  Resultant  Right gaze, left hemiplegia and left facial droop  MRI  Large right complete MCA infarct  MRA  right M1 cutoff  Carotid Doppler  Unremarkable  2D Echo  Unremarkable  LE venous Doppler pending   LDL 85, not at goal  HgbA1c pending  SCDs for VTE prophylaxis Diet NPO time specified  clopidogrel 75 mg orally every day prior to admission, now on aspirin 300 PR   Ongoing aggressive stroke risk factor management  Therapy recommendations:  SNF  Disposition:  Pending, transfer to stepdown  Cerebral edema  Showing on MRI  No midline shift  Will repeat CT head in a.m.  Family yet to decide whether they would like 3% saline if patient cerebral edema getting worse  Dysphagia  Nothing by mouth  Speech follow-up  Family yet to decide whether they would like feeding tube  Will start tube feeding if family agree with feeding tube.  ? Afib  EKG sinus rhythm but afib on monitor now  Afib on previous discharge summary as hx but no more documentation  Family not aware of this diagnosis  Continue tele monitoring  Hx of GIB  2011 EGD showed gastric AVM - continue bleeding despite APC  Esophageal ulcer  On home plavix  H&H stable  family stated GI bleeding was treated and she has no baseline GI bleeding  Hypertension  Home meds:   toprol  Stable Permissive hypertension (OK if <220/120) for 24-48 hours post stroke and then gradually normalized within 5-7 days.  Hyperlipidemia  Home meds:  none   LDL 85, goal < 70  Add  pravastatin once po access.  Continue statin at discharge  Diabetes  HgbA1c pending, goal < 7.0  Controlled  SSI  CBG monitoring  Dementia  On namenda at home  Leukocytosis  WBC 9.3-> 16.3-> 17.4  Daily CXR  No clear pneumonia so far  Afebrile  UA negative  Close monitoring  Other Stroke Risk Factors  Advanced age  Coronary artery disease  Other Active Problems  Leukocytosis - check CXR and UA  Elevated creatinine  Other Pertinent History    Hospital day # 4   I had a long discussion with the patient's husband and son yesterday afternoon and expended poor prognosis and they all agreed to DO NOT RESUSCITATE and comfort measures only. Patient is on morphine drip and resting peacefully. We'll arrange to transfer to hospice nursing home for the next few days Antony Contras, MD Stroke Neurology 09/08/2014 2:00 PM    To contact Stroke Continuity provider, please refer to [email or web address] After hours, contact General Neurology

## 2014-09-08 NOTE — Clinical Social Work Note (Signed)
Clinical Social Worker spoke with patient's dtr and HCPOA, Thayer Headings in reference to residential hospice placement. Pt's dtr reported she would like to discuss placement options (Home Hospice vs Residential Hospice) with pt's family before making final decision. Pt's dtr also stated Hosp[facility] in [facility] would be the closet for everyone.   Pt's dtr, Thayer Headings can be reached at [phone].  CSW advised pt's family to review residential hospice list provided at bedside.  Glendon Axe, MSW, LCSWA [phone] 09/08/2014 12:26 PM

## 2014-09-08 NOTE — Progress Notes (Signed)
Nutrition Brief Note  Patient identified due to low Braden score.  Wt Readings from Last 15 Encounters:  09/11/2014 134 lb 0.6 oz (60.8 kg)   Pt is currently NPO. Per chart pt is DNR and comfort measures only. She is on morphine drip and resting comfortably. No nutrition interventions warranted at this time. If nutrition issues arise, please consult RD.   Pryor Ochoa RD, LDN Inpatient Clinical Dietitian Pager: [phone] After Hours Pager: [phone]

## 2014-09-08 NOTE — Care Management Note (Signed)
    Page 1 of 1   09/08/2014     10:49:33 AM CARE MANAGEMENT NOTE 09/08/2014  Patient:  Megan Ho, Megan Ho   Account Number:  [id number]  Date Initiated:  09/07/2014  Documentation initiated by:  Sandi Mariscal  Subjective/Objective Assessment:   large stroke     Action/Plan:   family meeting and making decisions re: 3% saline gtt, feeding tube;  therapies recommending SNF at d/c; CSW aware   Anticipated DC Date:  09/11/2014   Anticipated DC Plan:  SKILLED NURSING FACILITY  In-house referral  Clinical Social Worker         Choice offered to / List presented to:             Status of service:  In process, will continue to follow Medicare Important Message given?  YES (If response is "NO", the following Medicare IM given date fields will be blank) Date Medicare IM given:  09/08/2014 Medicare IM given by:  Lorne Skeens Date Additional Medicare IM given:   Additional Medicare IM given by:    Discharge Disposition:    Per UR Regulation:  Reviewed for med. necessity/level of care/duration of stay  If discussed at [place] of Stay Meetings, dates discussed:   09/10/2014    Comments:  09/08/14 [place], MSN, CM- Medicare IM letter provided.

## 2014-10-04 NOTE — Progress Notes (Addendum)
Chaplain informed of pt death. Chaplain took initiative to say word of prayer over pt body. Family en route. Page chaplain should family need support. Chaplain later responded to page when family arrived. Pt pastor present at bedside and giving spiritual support to family. Page chaplain as needed.    10/04/2014 1000  Clinical Encounter Type  Visited With Health care provider  Visit Type Death;Spiritual support  Referral From Nurse  Spiritual Encounters  Spiritual Needs Prayer  Megan Ho 2014-10-04 10:02 AM

## 2014-10-04 NOTE — Progress Notes (Signed)
[employer] sheet completed and placed in patient's shadow chart.

## 2014-10-04 NOTE — Clinical Social Work Note (Signed)
Clinical Social Worker informed that patient has expired. Clinical Social Worker will sign off for now as social work intervention is no longer needed.   Glendon Axe, MSW, LCSWA [phone] 09/15/14 11:54 AM

## 2014-10-04 NOTE — Progress Notes (Signed)
Pt found unresponsive with no pulse  during assessment. Pt on comfort care. Pronounced by two nurses at 9:46am. Family (son) was notified stated that they were on the way.  Md notified. SW notified. Pt's body prepared for family to arrive. Will speak with family when they arrive about arrangements.

## 2014-10-04 NOTE — Discharge Summary (Signed)
Patient ID: KEYRY IRACHETA MRN: [id number] DOB/AGE: Sep 14, 1921 81 y.o.  Admit date: 09-10-14 Death date: Sep 15, 2014 at 07-20-2022: 69 am  Admission Diagnoses: Left sided weakness, facial droop  Cause of Death:  Large right hemispheric infarct with cytotoxic edema and brain herniation with respiratory failure secondary to right middle cerebral artery occlusion. Patient made DO NOT RESUSCITATE and comfort care by family  Pertinent Medical Diagnosis: Active Problems:   Cerebral infarction due to embolism of right middle cerebral artery   Stroke   Cerebral infarction due to thrombosis of middle cerebral artery   Comfort measures only status Cytotoxic cerebral edema  Hospital Course: Megan Ho is a 81 y.o. female who at baseline has a dementia and walks with a walker. Was able to get up this morning and eat breakfast. Her husband went to the bathroom and when he returned she was slumped over. EMS was called at that time and the patient was brought in as a code stroke with left sided weakness, facial droop At baseline sone reports that patient has severe dementia. Is able to walk with a walker. Dresses herself and is continent. Does not cook but can feed herself.  Date last known well: Date: 2014-09-10 Time last known well: Time: 10:00 tPA Given: Yes Pre-admission mRankin: 3 She was given full dose IV TPA and admitted to the intensive care unit for neurological monitoring and strict blood pressure control. She unfortunately did not recover and remain significantly hemiplegic on the left with right hand and gaze deviation. 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 I met several times with family and explained her prognosis to be poor. The patient's husband was quite clear that she did not want a feeding tube, ventilatory support or living in a nursing home requiring 24-hour care. Hence a family decided to make her DO NOT RESUSCITATE and comfort care. She was started on IV morphine drip for comfort and transferred out of the ICU to the palliative care unit. She remained peaceful and stable there is she passed away and was found to be pulseless and not breathing and pronounced dead by the RN at 9:46 AM on 2014/09/15 Signed: SETHI,PRAMOD September 15, 2014, 1:50 PM

## 2014-10-04 DEATH — deceased

## 2016-11-14 IMAGING — CT CT HEAD W/O CM
1 series · 15 of 30 positions shown, 19 images · non-contrast
Comparison: [DATE]/6504 0000 hours

CLINICAL DATA: Vomiting following tPA administration

EXAM:
CT HEAD WITHOUT CONTRAST
TECHNIQUE: Contiguous axial images were obtained from the base of the skull
through the vertex without intravenous contrast.

[Series 3: head 5.0 h30s · axial · 0.45mm/px · z∈[-123,+17]mm · 15 of 32 slices shown, 19 images]
[im 2/32  brain]
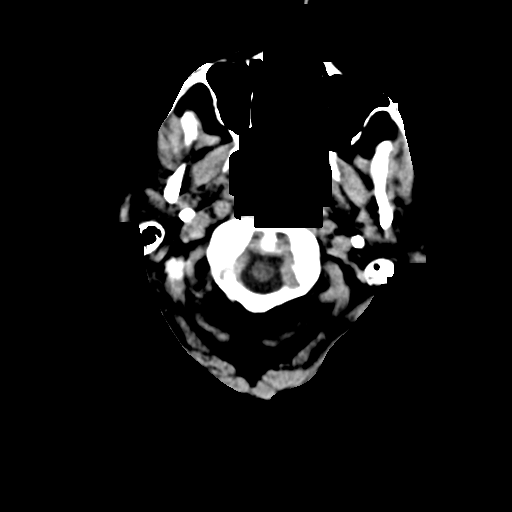
[im 2/32  bone]
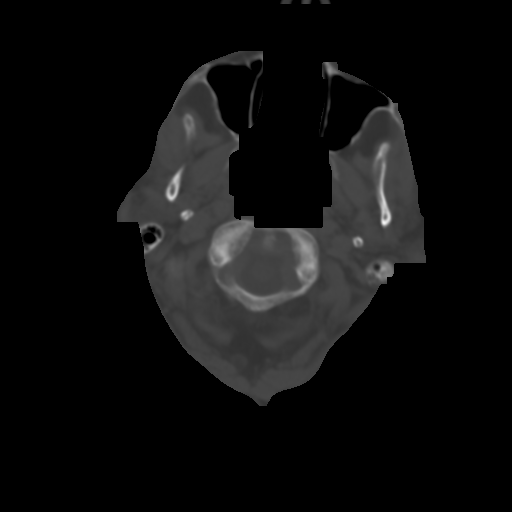
[im 4/32  brain]
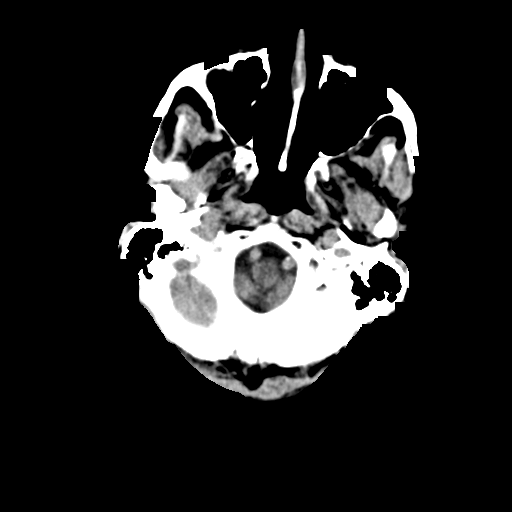
[im 6/32  brain]
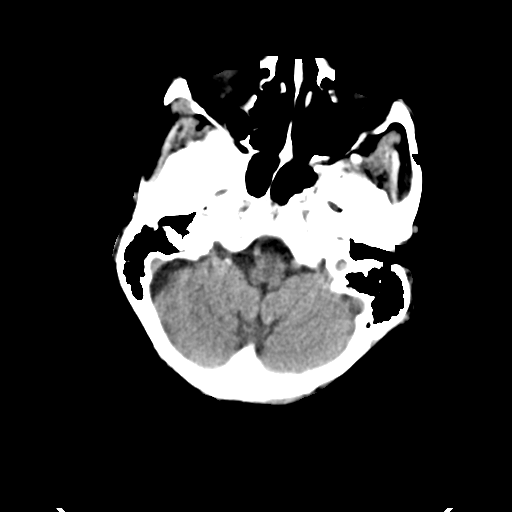
[im 8/32  brain]
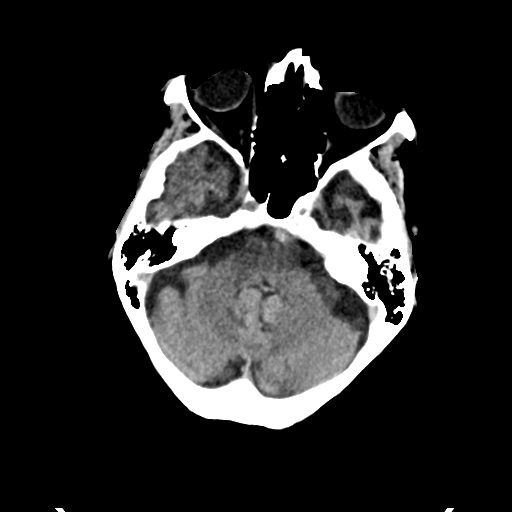
[im 10/32  brain]
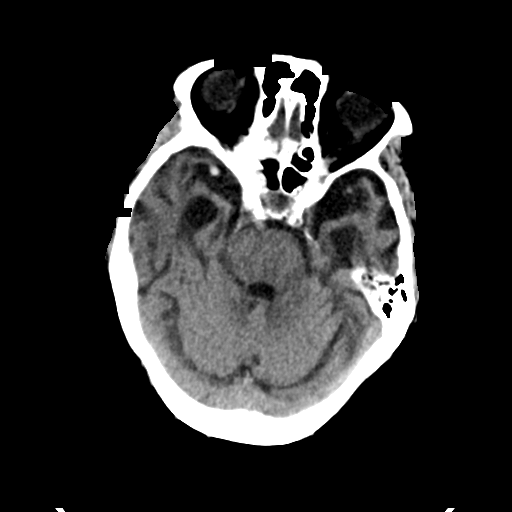
[im 10/32  bone]
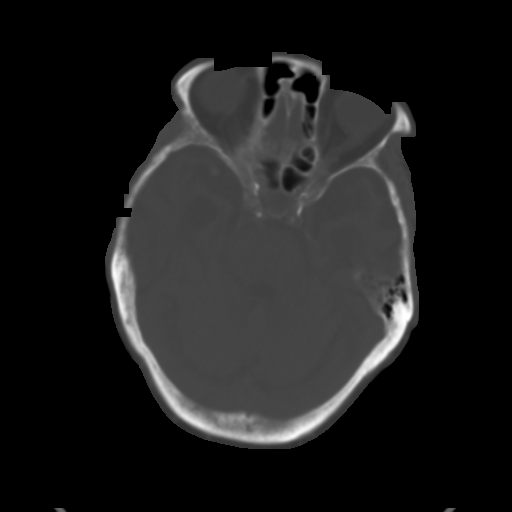
[im 12/32  brain]
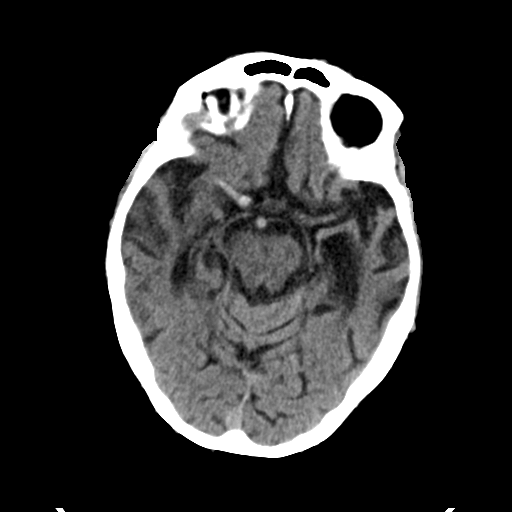
[im 14/32  brain]
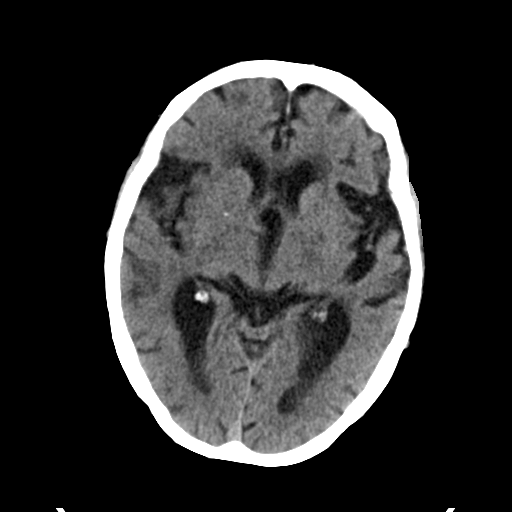
[im 17/32  brain]
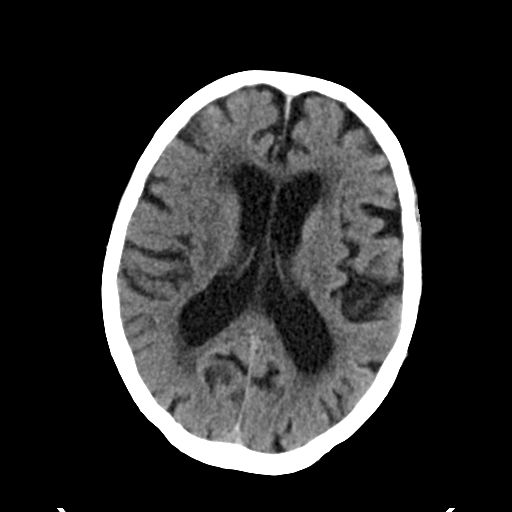
[im 18/32  brain]
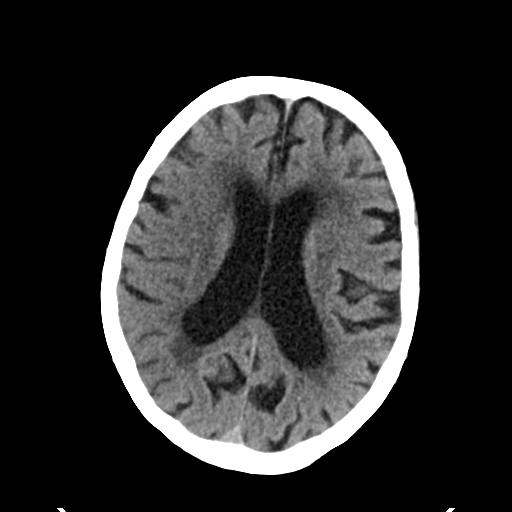
[im 18/32  bone]
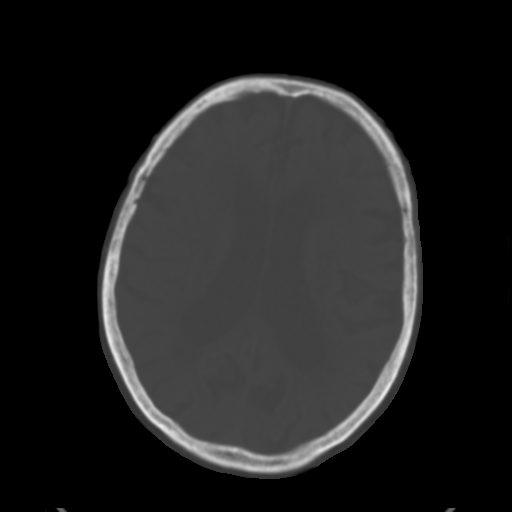
[im 20/32  brain]
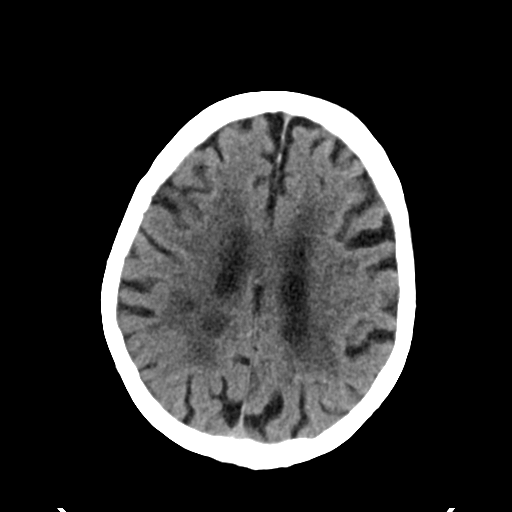
[im 22/32  brain]
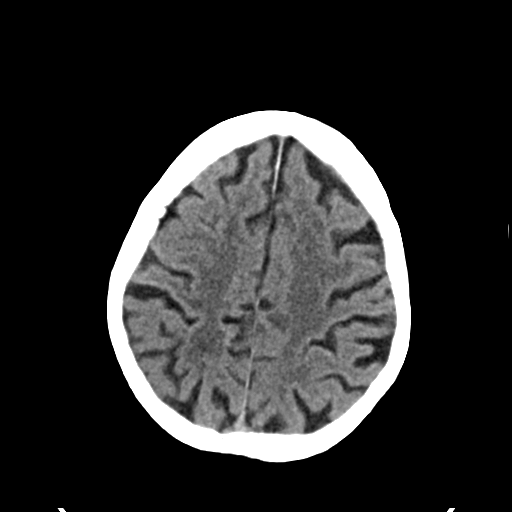
[im 24/32  brain]
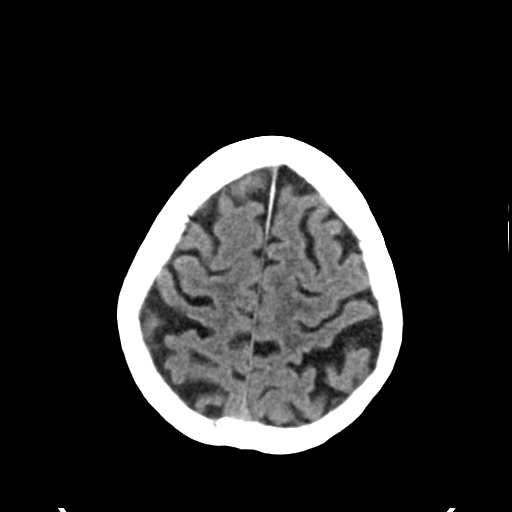
[im 26/32  brain]
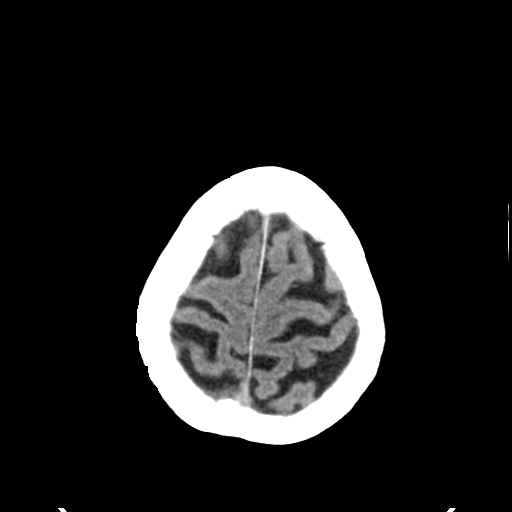
[im 26/32  bone]
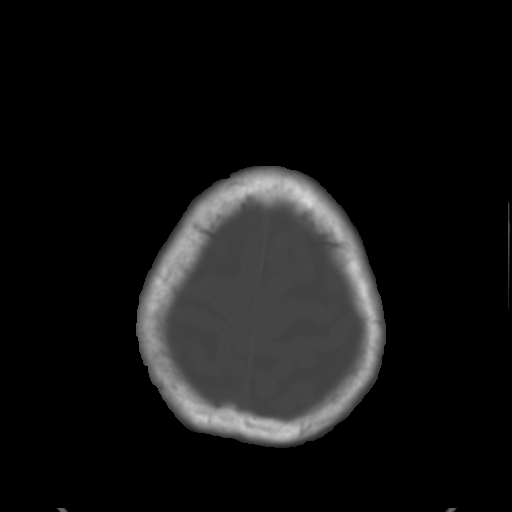
[im 28/32  brain]
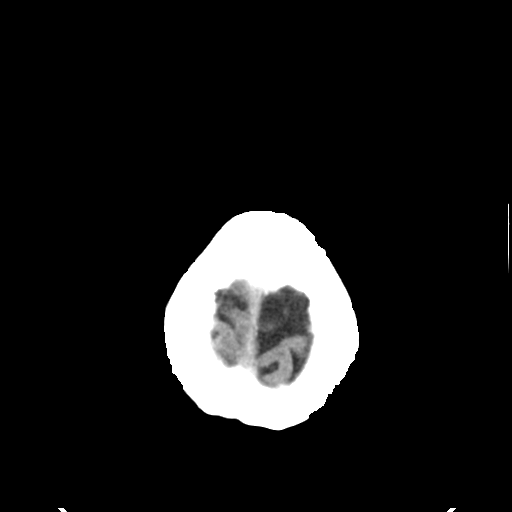
[im 30/32  brain]
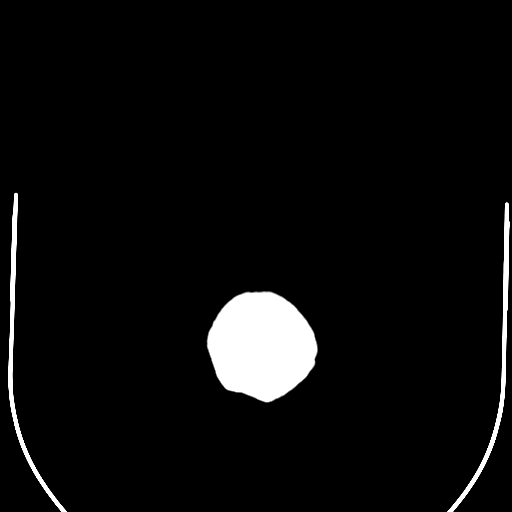

[15 of 30 positions shown; findings below may reference images not displayed]

FINDINGS: The bony calvarium is intact. Diffuse atrophic changes are noted and
stable from the prior exam. Chronic white matter ischemic changes
are seen.

Stable increased density is noted in the distal right internal
carotid artery and extending into the right middle cerebral artery
consistent with acute thrombosis. The overall appearance is stable.
No areas of acute hemorrhage are identified. There is some slight
progression in degree [REDACTED]reased attenuation within the region of
the basal ganglia and insula on the right.

The gaze is directed to the right. No other focal abnormality is
seen.
IMPRESSION: Stable changes of right middle cerebral artery and internal carotid
artery thrombosis.

No acute hemorrhage is noted.

Slight progression in the decreased attenuation seen in the right
basal ganglia and insula.

Critical Value/emergent results were called by telephone at the time
of interpretation on 09/04/2014 at [DATE] to Dr. ADEMIR TINGLE ,
who verbally acknowledged these results.

## 2016-11-15 IMAGING — DX DG CHEST 1V
1 series · 1 of 1 positions shown · non-contrast
Comparison: 08/19/2010

CLINICAL DATA: CHF, congestive heart failure

EXAM:
CHEST  1 VIEW

[chest ap]
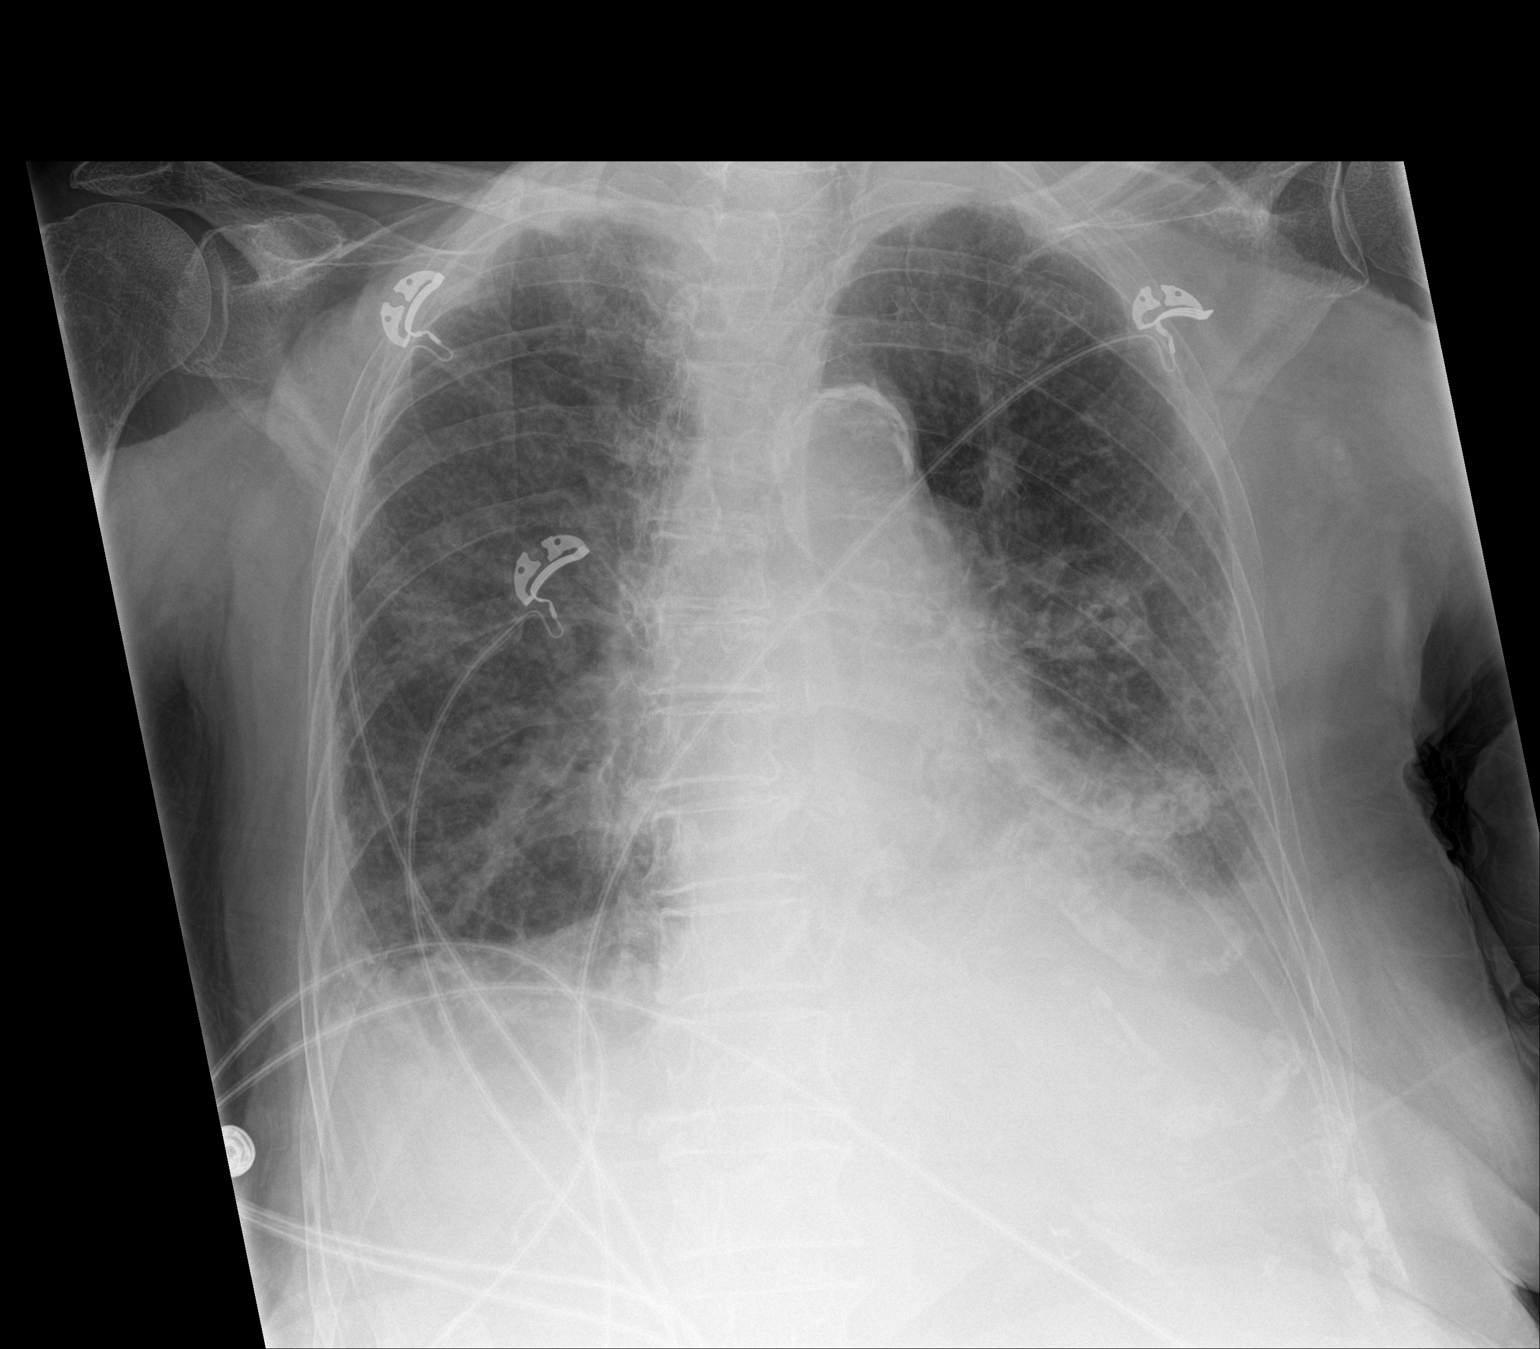

[1 of 1 positions shown; findings below may reference images not displayed]

FINDINGS: Mild enlargement of the cardiomediastinal silhouette is noted. Small
pleural effusions. Diffusely prominent interstitial markings are
noted with interstitial Kerley B-lines. No acute osseous
abnormality.
IMPRESSION: Mild interstitial pulmonary edema with small pleural effusions.

## 2016-11-17 IMAGING — CR DG CHEST 1V PORT
1 series · 1 of 1 positions shown · non-contrast
Comparison: 09/06/2014.

CLINICAL DATA: CHF .

EXAM:
PORTABLE CHEST - 1 VIEW

[AP]
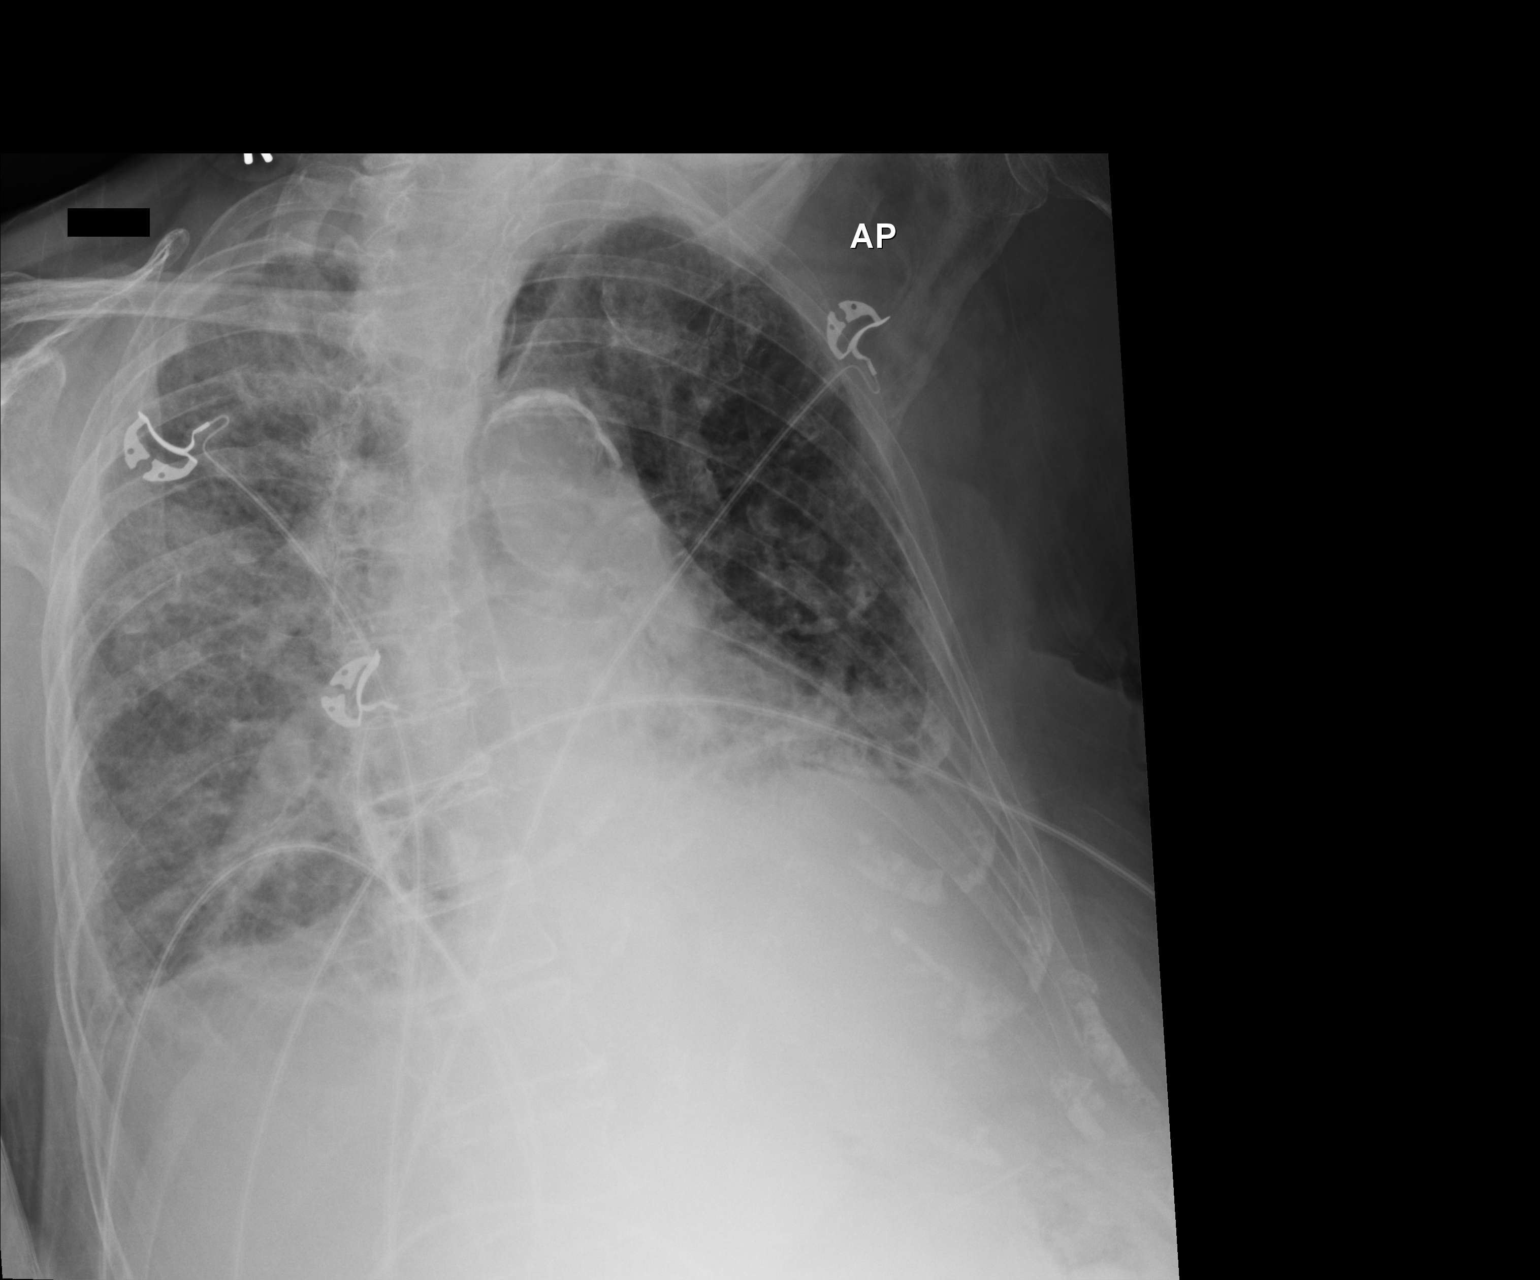

[1 of 1 positions shown; findings below may reference images not displayed]

FINDINGS: Patient is rotated to the left. Mediastinum and hilar structures
normal. Cardiomegaly with persistent bilateral pulmonary
interstitial prominence consistent congestive heart failure. Slight
interim progression cannot be excluded. Superimposed pneumonia
cannot be excluded. Tiny pleural effusions cannot be excluded. No
pneumothorax.
IMPRESSION: Persistent changes of congestive heart failure pulmonary
interstitial edema. Slight interim progression cannot be excluded.
Superimposed pneumonia particularly in the right upper lobe cannot
be excluded. Small pleural effusions.
